# Patient Record
Sex: Female | Born: 1960 | Race: White | Hispanic: No | Marital: Married
Health system: Midwestern US, Community
[De-identification: ages and names within clinical notes are randomized; demographics above are authoritative.]

## PROBLEM LIST (undated history)

## (undated) DIAGNOSIS — K219 Gastro-esophageal reflux disease without esophagitis: Secondary | ICD-10-CM

## (undated) DIAGNOSIS — Z801 Family history of malignant neoplasm of trachea, bronchus and lung: Secondary | ICD-10-CM

## (undated) DIAGNOSIS — R5383 Other fatigue: Secondary | ICD-10-CM

## (undated) DIAGNOSIS — J069 Acute upper respiratory infection, unspecified: Secondary | ICD-10-CM

## (undated) DIAGNOSIS — F419 Anxiety disorder, unspecified: Secondary | ICD-10-CM

## (undated) DIAGNOSIS — Z01419 Encounter for gynecological examination (general) (routine) without abnormal findings: Secondary | ICD-10-CM

---

## 2011-11-30 ENCOUNTER — Other Ambulatory Visit

## 2013-04-09 NOTE — Progress Notes (Signed)
Jessica Stanley is a 53 y.o. female in to establish care.  Pt states that she has been having off and on episodes of SOB.  She has a hx of Anxiety, in which she takes meds for it.  Exercise induced SOB and feelings of heart palps.  States that she has 30 episodes of the "all of a sudden SOB" a day.

## 2013-04-09 NOTE — Patient Instructions (Addendum)
Palpitations: After Your Visit  Your Care Instructions     Heart palpitations are the uncomfortable sensation that your heart is beating fast or irregularly. You might feel pounding or fluttering in your chest. It might feel like your heart is skipping a beat.  Although palpitations may be caused by a heart problem, they also occur because of stress, fatigue, or use of alcohol, caffeine, or nicotine. Many medicines, including diet pills, antihistamines, decongestants, and some herbal products, can cause heart palpitations. Nearly everyone has palpitations from time to time.  Depending on your symptoms, your doctor may need to do more tests to try to find the cause of your palpitations.  Follow-up care is a key part of your treatment and safety. Be sure to make and go to all appointments, and call your doctor if you are having problems. It's also a good idea to know your test results and keep a list of the medicines you take.  How can you care for yourself at home?  ?? Avoid caffeine, nicotine, and excess alcohol.  ?? Do not take illegal drugs, such as methamphetamines and cocaine.  ?? Do not take weight loss or diet medicines unless you talk with your doctor first.  ?? Get plenty of sleep.  ?? Do not overeat.  ?? If you have palpitations again, take deep breaths and try to relax. This may slow a racing heart.  ?? If you start to feel lightheaded, lie down to avoid injuries that might result if you pass out and fall down.  ?? Keep a record of your palpitations and bring it to your next doctor's appointment. Write down:  ?? The date and time.  ?? Your pulse. (If your heart is beating fast, it may be hard to count your pulse.)  ?? What you were doing when the palpitations started.  ?? How long the palpitations lasted.  ?? Any other symptoms.  ?? If an activity causes palpitations, slow down or stop. Talk to your doctor before you do that activity again.  ?? Take your medicines exactly as prescribed. Call your doctor if you think  you are having a problem with your medicine.  When should you call for help?  Call 911 anytime you think you may need emergency care. For example, call if:  ?? You passed out (lost consciousness).  ?? You have symptoms of a heart attack. These may include:  ?? Chest pain or pressure, or a strange feeling in the chest.  ?? Sweating.  ?? Shortness of breath.  ?? Pain, pressure, or a strange feeling in the back, neck, jaw, or upper belly or in one or both shoulders or arms.  ?? Lightheadedness or sudden weakness.  ?? A fast or irregular heartbeat.  After you call 911, the operator may tell you to chew 1 adult-strength or 2 to 4 low-dose aspirin. Wait for an ambulance. Do not try to drive yourself.  ?? You have signs of a stroke. These may include:  ?? Sudden numbness, paralysis, or weakness in your face, arm, or leg, especially on only one side of your body.  ?? New problems with walking or balance.  ?? Sudden vision changes.  ?? Drooling or slurred speech.  ?? New problems speaking or understanding simple statements, or feeling confused.  ?? A sudden, severe headache that is different from past headaches.  Call your doctor now or seek immediate medical care if:  ?? You have heart palpitations and:  ?? Are dizzy or lightheaded, or   you feel like you may faint.  ?? Have new or increased shortness of breath.  Watch closely for changes in your health, and be sure to contact your doctor if:  ?? You continue to have heart palpitations.   Where can you learn more?   Go to [email or web address]  Enter R508 in the search box to learn more about "Palpitations: After Your Visit."   ?? 2006-2014 Healthwise, Incorporated. Care instructions adapted under license by [employer] (which disclaims liability or warranty for this information). This care instruction is for use with your licensed healthcare professional. If you have questions about a medical condition or this instruction, always ask your healthcare professional. Healthwise,  Incorporated disclaims any warranty or liability for your use of this information.  Content Version: 10.2.346038; Current as of: May 17, 2012              Gastroesophageal Reflux Disease (GERD): After Your Visit  Your Care Instructions     Gastroesophageal reflux disease (GERD) is the backward flow of stomach acid into the esophagus. The esophagus is the tube that leads from your throat to your stomach. A one-way valve prevents the stomach acid from moving up into this tube. When you have GERD, this valve does not close tightly enough.  If you have mild GERD symptoms including heartburn, you may be able to control the problem with antacids or over-the-counter medicine. Changing your diet, losing weight, and making other lifestyle changes can also help reduce symptoms.  Follow-up care is a key part of your treatment and safety. Be sure to make and go to all appointments, and call your doctor if you are having problems. It???s also a good idea to know your test results and keep a list of the medicines you take.  How can you care for yourself at home?  ?? Take your medicines exactly as prescribed. Call your doctor if you think you are having a problem with your medicine.  ?? Your doctor may recommend over-the-counter medicine. For mild or occasional indigestion, antacids, such as Tums, Gaviscon, Mylanta, or Maalox, may help. Your doctor also may recommend over-the-counter acid reducers, such as Pepcid AC, Tagamet HB, Zantac 75, or Prilosec. Read and follow all instructions on the label. If you use these medicines often, talk with your doctor.  ?? Change your eating habits.  ?? It???s best to eat several small meals instead of two or three large meals.  ?? After you eat, wait 2 to 3 hours before you lie down.  ?? Chocolate, mint, and alcohol can make GERD worse.  ?? Spicy foods, foods that have a lot of acid (like tomatoes and oranges), and coffee can make GERD symptoms worse in some people. If your symptoms are worse after you  eat a certain food, you may want to stop eating that food to see if your symptoms get better.  ?? Do not smoke or chew tobacco. Smoking can make GERD worse. If you need help quitting, talk to your doctor about stop-smoking programs and medicines. These can increase your chances of quitting for good.  ?? If you have GERD symptoms at night, raise the head of your bed 6 to 8 inches by putting the frame on blocks or placing a foam wedge under the head of your mattress. (Adding extra pillows does not work.)  ?? Do not wear tight clothing around your middle.  ?? Lose weight if you need to. Losing just 5 to 10 pounds can help.  When should you  call for help?  Call your doctor now or seek immediate medical care if:  ?? You have new or different belly pain.  ?? Your stools are black and tarlike or have streaks of blood.  Watch closely for changes in your health, and be sure to contact your doctor if:  ?? Your symptoms have not improved after 2 days.  ?? Food seems to catch in your throat or chest.   Where can you learn more?   Go to [email or web address]  Enter [phone] 372-2663T927 in the search box to learn more about "Gastroesophageal Reflux Disease (GERD): After Your Visit."   ?? 2006-2014 Healthwise, Incorporated. Care instructions adapted under license by [employer] (which disclaims liability or warranty for this information). This care instruction is for use with your licensed healthcare professional. If you have questions about a medical condition or this instruction, always ask your healthcare professional. Healthwise, Incorporated disclaims any warranty or liability for your use of this information.  Content Version: 10.2.346038; Current as of: Jul 23, 2011              Cholesterol and Triglycerides Tests: About These Tests  What are they?  Cholesterol and triglycerides tests measure the amount of fats in your blood. These fats have both "good" (HDL) and "bad" (LDL) cholesterol.  Why are these tests done?  These tests are  done to help find out your risk of a heart attack and stroke. They can help your doctor find out how well medicine is working for some health problems.  How can you prepare for these tests?  ?? Your doctor may tell you to fast before your tests. This means that you do not eat or drink anything except water for 9 to 12 hours before the tests. In most cases, you can take your medicines with water the morning of the test.  ?? Do not eat high-fat foods the night before the tests.  ?? Do not drink alcohol or do intense exercise the night before the tests.  ?? Be sure to tell your doctor about all the over-the-counter and prescription medicines and herbs or other supplements you take. They can affect the results of these tests.  What happens during these tests?  A health professional takes a sample of your blood.  What else should you know about these tests?  Your cholesterol levels can help your doctor find out your risk for having a heart attack or stroke.  But it's not just about your cholesterol. Your doctor uses your cholesterol levels plus other things to calculate your risk. These include:  ?? Your blood pressure.  ?? Whether or not you have diabetes.  ?? Your age, sex, and race.  ?? Whether or not you smoke.  You and your doctor can talk about whether you need to lower your risk and what treatment is best for you.   Where can you learn more?   Go to [email or web address]  Enter [phone] in the search box to learn more about "Cholesterol and Triglycerides Tests: About These Tests."   ?? 2006-2014 Healthwise, Incorporated. Care instructions adapted under license by [employer]  (which disclaims liability or warranty for this information). This care instruction is for use with your licensed healthcare professional. If you have questions about a medical condition or this instruction, always ask your healthcare professional. Healthwise, Incorporated disclaims any warranty or liability for your use of this information.   Content Version: 10.2.346038; Current as of: May 17, 2012

## 2013-04-10 NOTE — Progress Notes (Signed)
HPI  Jessica Stanley is a 53 y.o. female that presents with  Chief Complaint   Patient presents with   ??? Establish Care   ??? Anxiety     Jessica Stanley is here to establish care. She was previously a pt of Dr. Leonor Stanley for 24 years. She reports she generally is healthy. She has h/o Genella Rife and takes zantac three times a day for total of 450 mg a day for few years. She reports that she tried prilosec but did not like the way it made her felt. She has never upper endoscopy and still has some breakthrough symptoms with known offending foods. She has increased flatus and burping. She has had 3 colonoscopies by Dr. 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 She takes takes miralax daily for constipation control. She is due for repeat colonoscopy this June. She reports she tries to eat healthy and be physically active.     She reports that since December with almost daily episodes of brief sob, in which her breath "catches"/brief palpitations/chest pressure and then she burps or cough and the symptoms stops. She had initially attibuted symptoms to possible anxiety, but she feels it may not be this now. She does admit to some anxiety at times especially if in claustrophobic situations or flying. She rarely uses xanax and denies need for refills..   No chest pain or symptoms presently. She drinks caffeine daily. No wheezing or h/o asthma.    Review of Systems:  ROS:  History obtained from the patient  ?? General: negative for - chills, fever, weight changes or malaise, or fatigue  ?? HEENT: no sore throat, nasal congestion,  vision problems. Off/on ear pressure.  ?? Respiratory: no cough,  or wheezing  ?? Cardiovascular: see hpi.  ?? Gastrointestinal: no abdominal pain, N/V, change in bowel habits, or black or bloody stools  ?? Genitourinary:  No dysuria or hematuria  ?? Musculoskeletal: no back pain, joint pain, joint stiffness, muscle pain or muscle weakness  ?? Neurological: no numbness, tingling, headache or dizziness, or  seizures  ?? Dermatological:  No rashes or other skin concerns  ?? Psychological: negative for -  depression, sleep disturbances, suicidal or homicidal ideations      Past Medical History   Diagnosis Date   ??? Anxiety        Family History   Problem Relation Age of Onset   ??? Cancer Mother      breast   ??? Stroke Father    ??? Heart Disease Father    ??? Hypertension Father    ??? Heart Disease Maternal Grandmother    ??? Heart Failure Maternal Grandmother    ??? Heart Disease Maternal Grandfather    ??? No Known Problems Paternal Grandmother    ??? Heart Attack Paternal Grandfather        Past Surgical History   Procedure Laterality Date   ??? Hx colonoscopy     ??? Hx appendectomy     ??? Hx partial hysterectomy       Removal of both fallopian tubes and one ovary.   ??? Hx cesarean section     ??? Hx other surgical       Dental implants   ??? Hx orthopaedic  1982     Broken right hand       History     Social History   ??? Marital Status: MARRIED     Spouse Name: N/A     Number of  Children: N/A   ??? Years of Education: N/A     Occupational History   ??? Not on file.     Social History Main Topics   ??? Smoking status: Former Smoker -- 1.00 packs/day for 15 years     Types: Cigarettes     Start date: 04/09/1978     Quit date: 04/09/1993   ??? Smokeless tobacco: Never Used   ??? Alcohol Use: Yes      Comment: 2 glasses of wine a day   ??? Drug Use: No   ??? Sexually Active: Yes -- Female partner(s)     Birth Control/ Protection: None     Other Topics Concern   ??? Military Service No   ??? Blood Transfusions No   ??? Caffeine Concern No   ??? Occupational Exposure No   ??? Hobby Hazards No   ??? Sleep Concern No   ??? Stress Concern Yes     Job related   ??? Weight Concern No   ??? Special Diet No   ??? Back Care Yes   ??? Exercise Yes   ??? Seat Belt Yes   ??? Self-Exams Yes     Social History Narrative   ??? No narrative on file       Outpatient Prescriptions Marked as Taking for the 04/09/13 encounter (Office Visit) with Jessica Galas, MD   Medication Sig Dispense Refill   ??? ranitidine  (ZANTAC) 150 mg tablet Take 450 mg by mouth daily.       ??? ALPRAZolam (XANAX) 0.25 mg tablet Take 0.25 mg by mouth nightly as needed.       ??? ibuprofen (ADVIL) 200 mg tablet Take 600 mg by mouth every eight (8) hours as needed.       ??? cholecalciferol, vitamin D3, (VITAMIN D3) 2,000 unit tab Take 4,000 Units by mouth daily.       ??? progesterone (PROMETRIUM) 100 mg capsule Take 100 mg by mouth daily.           Allergies   Allergen Reactions   ??? Pcn [Penicillins] Angioedema       Filed Vitals:    04/09/13 1045   BP: 117/59   Pulse: 87   Temp: 98 ??F (36.7 ??C)   TempSrc: Oral   Resp: 18   Height: 5\' 1"  (1.549 m)   Weight: 125 lb (56.7 kg)   SpO2: 97%         Physical Examination: General appearance - alert, well appearing, and in no distress    Mental status - alert, oriented to person, place, and time  Eyes - pupils equal and reactive, extraocular eye movements intact  Ears - bilateral TM's and external ear canals normal  Nose - normal and patent, no erythema or discharge , sinuses normal and nontender   Mouth - mucous membranes moist, pharynx normal without lesions   Neck - supple, no significant adenopathy  PULM: clear to auscultation, no wheezes, rales or rhonchi, symmetric air entry  CVS - normal rate, regular rhythm, normal S1, S2, no murmurs, rubs, clicks or gallops  GI - soft, nontender, nondistended, no masses or organomegaly   Back exam - full range of motion, no tenderness, palpable spasm or pain on motion  Neurological - alert, oriented, normal speech, no focal findings or movement disorder noted   Musculoskeletal - no joint tenderness, deformity or swelling  Extremities - no pedal edema noted, good pulses, normal color, temperature   Skin - normal coloration and turgor,  no rashes noted or suspicious skin lesions noted on exposed surfaces    LABS/IMAGING:  No results found for any previous visit.    Assessment/ Plan    Diagnoses and associated orders for this visit:    Heart palpitations: she is advised to  avoid caffeine,which can exascerbate palpitations. ECG sinus rhythm with no acute changes. WNL  - AMB POC EKG ROUTINE W/ 12 LEADS, INTER & REP  - REFERRAL TO CARDIOLOGY  - LIPID PANEL  - TSH+FREE T4  - METABOLIC PANEL, COMPREHENSIVE  - CBC WITH AUTOMATED DIFF    Encounter to establish care    GERD (gastroesophageal reflux disease): as pt taken zantac in excess dosage, and has breakthrough gerd symptoms. Feel prudent she may need upper endoscopy. She prefers to use her current GI doctor. She is advised to try otc prevacid instead of zantac. She is advised to avoid nsaids which may exascerbate gerd.   - REFERRAL TO GASTROENTEROLOGY  - LIPID PANEL      Follow-up Disposition:  Return in about 1 month (around 05/07/2013), or if symptoms worsen or fail to improve. Return for fasting labs this week..  She is advised if persistent palpations, sob, onset of chest pain, worsenig, to seek immediate medical care. She verbalized understanding.   I have discussed the diagnosis with the patient and the intended plan as seen in the above orders.  The patient has received an after-visit summary and questions were answered concerning future plans.     Medication Side Effects and Warnings were discussed with patient: yes      Darene LamerLYNNETTE M Kayd Launer, MD

## 2013-04-22 LAB — METABOLIC PANEL, COMPREHENSIVE
A-G Ratio: 1.7 (ref 1.1–2.5)
ALT (SGPT): 12 IU/L (ref 0–32)
AST (SGOT): 17 IU/L (ref 0–40)
Albumin: 4.3 g/dL (ref 3.5–5.5)
Alk. phosphatase: 79 IU/L (ref 39–117)
BUN/Creatinine ratio: 16 (ref 9–23)
BUN: 12 mg/dL (ref 6–24)
Bilirubin, total: 0.5 mg/dL (ref 0.0–1.2)
CO2: 27 mmol/L (ref 18–29)
Calcium: 9.4 mg/dL (ref 8.7–10.2)
Chloride: 100 mmol/L (ref 97–108)
Creatinine: 0.74 mg/dL (ref 0.57–1.00)
GFR est AA: 106 mL/min/{1.73_m2} (ref >59)
GFR est non-AA: 92 mL/min/{1.73_m2} (ref >59)
GLOBULIN, TOTAL: 2.5 g/dL (ref 1.5–4.5)
Glucose: 91 mg/dL (ref 65–99)
Potassium: 4.5 mmol/L (ref 3.5–5.2)
Protein, total: 6.8 g/dL (ref 6.0–8.5)
Sodium: 141 mmol/L (ref 134–144)

## 2013-04-22 LAB — CBC WITH AUTOMATED DIFF
ABS. BASOPHILS: 0.1 10*3/uL (ref 0.0–0.2)
ABS. EOSINOPHILS: 0.3 10*3/uL (ref 0.0–0.4)
ABS. IMM. GRANS.: 0 10*3/uL (ref 0.0–0.1)
ABS. MONOCYTES: 0.4 10*3/uL (ref 0.1–0.9)
ABS. NEUTROPHILS: 2.5 10*3/uL (ref 1.4–7.0)
Abs Lymphocytes: 1.3 10*3/uL (ref 0.7–3.1)
BASOPHILS: 1 %
EOSINOPHILS: 7 %
HCT: 42.6 % (ref 34.0–46.6)
HGB: 14.4 g/dL (ref 11.1–15.9)
IMMATURE GRANULOCYTES: 0 %
Lymphocytes: 28 %
MCH: 30 pg (ref 26.6–33.0)
MCHC: 33.8 g/dL (ref 31.5–35.7)
MCV: 89 fL (ref 79–97)
MONOCYTES: 9 %
NEUTROPHILS: 55 %
PLATELET: 252 10*3/uL (ref 150–379)
RBC: 4.8 x10E6/uL (ref 3.77–5.28)
RDW: 12.5 % (ref 12.3–15.4)
WBC: 4.6 10*3/uL (ref 3.4–10.8)

## 2013-04-22 LAB — LIPID PANEL
Cholesterol, total: 197 mg/dL (ref 100–199)
HDL Cholesterol: 116 mg/dL (ref >39)
LDL, calculated: 73 mg/dL (ref 0–99)
Triglyceride: 42 mg/dL (ref 0–149)
VLDL, calculated: 8 mg/dL (ref 5–40)

## 2013-04-22 LAB — CVD REPORT: PDF IMAGE: 0

## 2013-04-22 LAB — TSH+FREE T4
Free T4: 1 ng/dL
TSH-ICMA: 1.4 uU/mL

## 2013-04-23 NOTE — Telephone Encounter (Signed)
-----   Message from Penelope GalasLynnette Moore, MD sent at 04/23/2013 10:06 AM EST -----  Please advise patient her labwork is normal.

## 2013-04-23 NOTE — Telephone Encounter (Signed)
Called pt to advise of the below stated information, no answ LVM to advise

## 2013-04-23 NOTE — Telephone Encounter (Signed)
-----   Message from Lynnette Moore, MD sent at 04/23/2013 10:06 AM EST -----  Please advise patient her labwork is normal.

## 2013-04-23 NOTE — Telephone Encounter (Signed)
Called and spk to patient via cell phone, advised of the below stated information

## 2013-04-23 NOTE — Progress Notes (Signed)
Quick Note:    Please advise patient her labwork is normal.  ______

## 2013-05-09 NOTE — Telephone Encounter (Signed)
Left message for patient to call office regarding new patient referral for palpitations.

## 2013-08-02 NOTE — Patient Instructions (Signed)
Leg Pain: After Your Visit  Your Care Instructions  Many things can cause leg pain. Too much exercise or overuse can cause a muscle cramp (or charley horse). You can get leg cramps from not eating a balanced diet that has enough potassium, calcium, and other minerals. If you do not drink enough fluids or are taking certain medicines, you may develop leg cramps. Other causes of leg pain include injuries, blood flow problems, nerve damage, and twisted and enlarged veins (varicose veins).  You can usually ease pain with self-care. Your doctor may recommend that you rest your leg and keep it elevated.  Follow-up care is a key part of your treatment and safety. Be sure to make and go to all appointments, and call your doctor if you are having problems. It???s also a good idea to know your test results and keep a list of the medicines you take.  How can you care for yourself at home?  ?? Take pain medicines exactly as directed.  ?? If the doctor gave you a prescription medicine for pain, take it as prescribed.  ?? If you are not taking a prescription pain medicine, ask your doctor if you can take an over-the-counter medicine.  ?? Take any other medicines exactly as prescribed. Call your doctor if you think you are having a problem with your medicine.  ?? Rest your leg while you have pain, and avoid standing for long periods of time.  ?? Prop up your leg at or above the level of your heart when possible.  ?? Make sure you are eating a balanced diet that is rich in calcium, potassium, and magnesium, especially if you are pregnant.  ?? If directed by your doctor, put ice or a cold pack on the area for 10 to 20 minutes at a time. Put a thin cloth between the ice and your skin.  ?? Your leg may be in a splint, a brace, or an elastic bandage, and you may have crutches to help you walk. Follow your doctor's directions about how long to wear supports and how to use the crutches.  When should you call for help?  Call 911 anytime you think  you may need emergency care. For example, call if:  ?? You have sudden chest pain and shortness of breath, or you cough up blood.  ?? Your leg is cool or pale or changes color.  Call your doctor now or seek immediate medical care if:  ?? You have increasing or severe pain.  ?? Your leg suddenly feels weak and you cannot move it.  ?? You have signs of a blood clot, such as:  ?? Pain in your calf, back of the knee, thigh, or groin.  ?? Redness and swelling in your leg or groin.  ?? You have signs of infection, such as:  ?? Increased pain, swelling, warmth, or redness.  ?? Red streaks leading from the sore area.  ?? Pus draining from a place on your leg.  ?? A fever.  ?? You cannot bear weight on your leg.  Watch closely for changes in your health, and be sure to contact your doctor if:  ?? You do not get better as expected.   Where can you learn more?   Go to http://www.healthwise.net/[facility]  Enter W987 in the search box to learn more about "Leg Pain: After Your Visit."   ?? 2006-2015 Healthwise, Incorporated. Care instructions adapted under license by [employer] (which disclaims liability or warranty for this information).   This care instruction is for use with your licensed healthcare professional. If you have questions about a medical condition or this instruction, always ask your healthcare professional. Healthwise, Incorporated disclaims any warranty or liability for your use of this information.  Content Version: 10.4.390249; Current as of: January 19, 2013

## 2013-08-02 NOTE — Progress Notes (Signed)
HPI  Jessica Stanley is a 53 y.o. female that presents with  Chief Complaint   Patient presents with   ??? Leg Pain     Pt reports that she has noted 4 weeks of left posterior upper leg pain in same location. No known trauma. She is physically active and works out. No long car/plane trips. No redness or swelling. No sob or chest pain. She is not sure if just muscle, but id concerned if possible blood clot since her father had one in his 69's. She has noted off/on left knee discomfort with working out, mainly with squats.     She also needs referral to Dr. Constance Goltz, her gi doctor. She had a colon polyp removed last summer and was advised to have repeat one done in one year.     Review of Systems:  ROS:  History obtained from the patient  ?? General: negative for - chills, fever  ?? HEENT: no sore throat  ?? Respiratory: no cough, shortness of breath, or wheezing  ?? Cardiovascular: no chest pain, palpitations, or dyspnea on exertion  ?? Gastrointestinal: no abdominal pain, N/V, change in bowel habits, or black or bloody stools  ?? Genitourinary:  No dysuria or hematuria  ?? Musculoskeletal: see hpi.  ?? Neurological: no numbness, tingling, headache or dizziness, or seizures  ?? Dermatological:  No rashes or other skin concerns  ?? Psychological: negative for - anxiety, depression, sleep disturbances, suicidal or homicidal ideations      Past Medical History   Diagnosis Date   ??? Anxiety        Family History   Problem Relation Age of Onset   ??? Cancer Mother      breast   ??? Stroke Father    ??? Heart Disease Father    ??? Hypertension Father    ??? Heart Disease Maternal Grandmother    ??? Heart Failure Maternal Grandmother    ??? Heart Disease Maternal Grandfather    ??? No Known Problems Paternal Grandmother    ??? Heart Attack Paternal Grandfather        Past Surgical History   Procedure Laterality Date   ??? Hx colonoscopy     ??? Hx appendectomy     ??? Hx partial hysterectomy       Removal of both fallopian tubes and one ovary.   ??? Hx cesarean  section     ??? Hx other surgical       Dental implants   ??? Hx orthopaedic  1982     Broken right hand       History     Social History   ??? Marital Status: MARRIED     Spouse Name: N/A     Number of Children: N/A   ??? Years of Education: N/A     Occupational History   ??? Not on file.     Social History Main Topics   ??? Smoking status: Former Smoker -- 1.00 packs/day for 15 years     Types: Cigarettes     Start date: 04/09/1978     Quit date: 04/09/1993   ??? Smokeless tobacco: Never Used   ??? Alcohol Use: Yes      Comment: 2 glasses of wine a day   ??? Drug Use: No   ??? Sexual Activity:     Partners: Male     Patent examiner Protection: None     Other Topics Concern   ??? Military Service No   ???  Blood Transfusions No   ??? Caffeine Concern No   ??? Occupational Exposure No   ??? Hobby Hazards No   ??? Sleep Concern No   ??? Stress Concern Yes     Job related   ??? Weight Concern No   ??? Special Diet No   ??? Back Care Yes   ??? Exercise Yes   ??? Seat Belt Yes   ??? Self-Exams Yes     Social History Narrative       Allergies   Allergen Reactions   ??? Pcn [Penicillins] Angioedema       Filed Vitals:    08/02/13 1627   BP: 110/84   Pulse: 71   Temp: 98 ??F (36.7 ??C)   TempSrc: Oral   Resp: 16   Height: 5' 0.5" (1.537 m)   Weight: 122 lb 12.8 oz (55.702 kg)         Physical Examination: General appearance - alert, well appearing, and in no distress    Mental status - alert, oriented to person, place, and time  PULM: clear to auscultation, no wheezes, rales or rhonchi, symmetric air entry  CVS - normal rate, regular rhythm, normal S1, S2, no murmurs, rubs, clicks or gallops  GI - soft, nontender, nondistended, no masses or organomegaly   Back exam - full range of motion, no tenderness, palpable spasm or pain on motion  Neurological - alert, oriented, normal speech, no focal findings or movement disorder noted   Musculoskeletal - no joint tenderness, deformity or swelling; mild left knee crepitation.  Extremities - no pedal edema noted, good pulses, normal  color, temperature; localized reproducible mid hamstring area tenderness to deep palpation, no redness or swelling or obvious cord noted.   Skin - normal coloration and turgor, no rashes noted or suspicious skin lesions noted on exposed surfaces    LABS/IMAGING:  No visits with results within 3 Month(s) from this visit.  Latest known visit with results is:    Office Visit on 04/09/2013   Component Date Value Ref Range Status   ??? Cholesterol, total 04/13/2013 197  100 - 199 mg/dL Final   ??? Triglyceride 04/13/2013 42  0 - 149 mg/dL Final   ??? HDL Cholesterol 04/13/2013 116  >39 mg/dL Final    Comment: According to ATP-III Guidelines, HDL-C >59 mg/dL is considered a  negative risk factor for CHD.     ??? VLDL, calculated 04/13/2013 8  5 - 40 mg/dL Final   ??? LDL, calculated 04/13/2013 73  0 - 99 mg/dL Final   ??? TSH-ICMA 04/13/2013 1.4   Final    Comment: Reference Range:  Pubertal Children and Adults: 0.5 - 4.8     ??? Free T4 04/13/2013 1.0   Final    Comment: Reference Range:  Pubertal Children and Adults: 0.8 - 1.7     ??? Glucose 04/13/2013 91  65 - 99 mg/dL Final   ??? BUN 04/13/2013 12  6 - 24 mg/dL Final   ??? Creatinine 04/13/2013 0.74  0.57 - 1.00 mg/dL Final   ??? GFR est non-AA 04/13/2013 92  >59 mL/min/1.73 Final   ??? GFR est AA 04/13/2013 106  >59 mL/min/1.73 Final   ??? BUN/Creatinine ratio 04/13/2013 16  9 - 23 Final   ??? Sodium 04/13/2013 141  134 - 144 mmol/L Final   ??? Potassium 04/13/2013 4.5  3.5 - 5.2 mmol/L Final   ??? Chloride 04/13/2013 100  97 - 108 mmol/L Final   ??? CO2 04/13/2013  27  18 - 29 mmol/L Final   ??? Calcium 04/13/2013 9.4  8.7 - 10.2 mg/dL Final   ??? Protein, total 04/13/2013 6.8  6.0 - 8.5 g/dL Final   ??? Albumin 04/13/2013 4.3  3.5 - 5.5 g/dL Final   ??? GLOBULIN, TOTAL 04/13/2013 2.5  1.5 - 4.5 g/dL Final   ??? A-G Ratio 04/13/2013 1.7  1.1 - 2.5 Final   ??? Bilirubin, total 04/13/2013 0.5  0.0 - 1.2 mg/dL Final   ??? Alk. phosphatase 04/13/2013 79  39 - 117 IU/L Final   ??? AST 04/13/2013 17  0 - 40 IU/L Final    ??? ALT 04/13/2013 12  0 - 32 IU/L Final   ??? WBC 04/13/2013 4.6  3.4 - 10.8 x10E3/uL Final   ??? RBC 04/13/2013 4.80  3.77 - 5.28 x10E6/uL Final   ??? HGB 04/13/2013 14.4  11.1 - 15.9 g/dL Final   ??? HCT 04/13/2013 42.6  34.0 - 46.6 % Final   ??? MCV 04/13/2013 89  79 - 97 fL Final   ??? MCH 04/13/2013 30.0  26.6 - 33.0 pg Final   ??? MCHC 04/13/2013 33.8  31.5 - 35.7 g/dL Final   ??? RDW 04/13/2013 12.5  12.3 - 15.4 % Final   ??? PLATELET 04/13/2013 252  150 - 379 x10E3/uL Final   ??? NEUTROPHILS 04/13/2013 55   Final   ??? Lymphocytes 04/13/2013 28   Final   ??? MONOCYTES 04/13/2013 9   Final   ??? EOSINOPHILS 04/13/2013 7   Final   ??? BASOPHILS 04/13/2013 1   Final   ??? ABS. NEUTROPHILS 04/13/2013 2.5  1.4 - 7.0 x10E3/uL Final   ??? Abs Lymphocytes 04/13/2013 1.3  0.7 - 3.1 x10E3/uL Final   ??? ABS. MONOCYTES 04/13/2013 0.4  0.1 - 0.9 x10E3/uL Final   ??? ABS. EOSINOPHILS 04/13/2013 0.3  0.0 - 0.4 x10E3/uL Final   ??? ABS. BASOPHILS 04/13/2013 0.1  0.0 - 0.2 x10E3/uL Final   ??? IMMATURE GRANULOCYTES 04/13/2013 0   Final   ??? ABS. IMM. GRANS. 04/13/2013 0.0  0.0 - 0.1 x10E3/uL Final   ??? INTERPRETATION 04/13/2013 Note   Final    For Interpretations, please refer to [facility] CDS Patient Report.   ??? PDF IMAGE 04/13/2013 .   Final       Assessment/ Plan    Jazelle was seen today for leg pain.    Diagnoses and associated orders for this visit:    Leg pain, posterior, left: though it may be muscular origin, cannot r/o vascular source of pain (lower likelihood based on exam) and advise vein [place]. Family h/o dvt.  - REFERRAL TO VASCULAR CENTER    Referral of patient  - REFERRAL TO GASTROENTEROLOGY      Follow-up Disposition:  Return if symptoms worsen or fail to improve.  I have reviewed return/Er/medication precautions and  discussed the diagnosis with the patient (or guardian/parent if applicable)  and the intended plan as seen in the above orders.  The patient has been offered and/or received an after-visit summary and questions were answered concerning  future plans. Patient verbalized understanding of all instruction.    Cheri Rous, MD

## 2013-08-02 NOTE — Progress Notes (Signed)
Pt Jessica Stanley is a 53 y.o. female in office today for left leg pain.  Per pt stated tht pain is intermittent in the back of thigh.

## 2013-11-08 MED ORDER — PANTOPRAZOLE 20 MG TAB, DELAYED RELEASE
20 mg | ORAL_TABLET | Freq: Every day | ORAL | Status: DC
Start: 2013-11-08 — End: 2014-01-24

## 2013-11-08 NOTE — Patient Instructions (Signed)
Gastroesophageal Reflux Disease (GERD): After Your Visit  Your Care Instructions     Gastroesophageal reflux disease (GERD) is the backward flow of stomach acid into the esophagus. The esophagus is the tube that leads from your throat to your stomach. A one-way valve prevents the stomach acid from moving up into this tube. When you have GERD, this valve does not close tightly enough.  If you have mild GERD symptoms including heartburn, you may be able to control the problem with antacids or over-the-counter medicine. Changing your diet, losing weight, and making other lifestyle changes can also help reduce symptoms.  Follow-up care is a key part of your treatment and safety. Be sure to make and go to all appointments, and call your doctor if you are having problems. It???s also a good idea to know your test results and keep a list of the medicines you take.  How can you care for yourself at home?  ?? Take your medicines exactly as prescribed. Call your doctor if you think you are having a problem with your medicine.  ?? Your doctor may recommend over-the-counter medicine. For mild or occasional indigestion, antacids, such as Tums, Gaviscon, Mylanta, or Maalox, may help. Your doctor also may recommend over-the-counter acid reducers, such as Pepcid AC, Tagamet HB, Zantac 75, or Prilosec. Read and follow all instructions on the label. If you use these medicines often, talk with your doctor.  ?? Change your eating habits.  ?? It???s best to eat several small meals instead of two or three large meals.  ?? After you eat, wait 2 to 3 hours before you lie down.  ?? Chocolate, mint, and alcohol can make GERD worse.  ?? Spicy foods, foods that have a lot of acid (like tomatoes and oranges), and coffee can make GERD symptoms worse in some people. If your symptoms are worse after you eat a certain food, you may want to stop eating that food to see if your symptoms get better.   ?? Do not smoke or chew tobacco. Smoking can make GERD worse. If you need help quitting, talk to your doctor about stop-smoking programs and medicines. These can increase your chances of quitting for good.  ?? If you have GERD symptoms at night, raise the head of your bed 6 to 8 inches by putting the frame on blocks or placing a foam wedge under the head of your mattress. (Adding extra pillows does not work.)  ?? Do not wear tight clothing around your middle.  ?? Lose weight if you need to. Losing just 5 to 10 pounds can help.  When should you call for help?  Call your doctor now or seek immediate medical care if:  ?? You have new or different belly pain.  ?? Your stools are black and tarlike or have streaks of blood.  Watch closely for changes in your health, and be sure to contact your doctor if:  ?? Your symptoms have not improved after 2 days.  ?? Food seems to catch in your throat or chest.   Where can you learn more?   Go to http://www.healthwise.net/[facility]  Enter T927 in the search box to learn more about "Gastroesophageal Reflux Disease (GERD): After Your Visit."   ?? 2006-2015 Healthwise, Incorporated. Care instructions adapted under license by [facility] (which disclaims liability or warranty for this information). This care instruction is for use with your licensed healthcare professional. If you have questions about a medical condition or this instruction, always ask your healthcare professional.   Healthwise, Incorporated disclaims any warranty or liability for your use of this information.  Content Version: 10.5.422740; Current as of: January 19, 2013

## 2013-11-08 NOTE — Progress Notes (Signed)
HPI  Jessica Stanley is a 53 y.o. female that presents with  Chief Complaint   Patient presents with   ??? Abdominal Pain     reflux   ??? Gas     Yesterday pt ate at [facility] place and had extra sauces.  Later in evening with increased gassiness, with burping and flatus, which has persisted. She has had random sharp brief pains in right upper abdomen. No fevers, chills, vomiting, black/bloody stools or diarrhea. No rashes or joint pains, headaches or dizziness..  No chest pain. She has long h/o gerd breakthrough despite using zantac. She reports she has planned colonoscopy this month. She has not discussed her gerd with her GI provider.      Review of Systems:  ROS:  See hpi. No sob or chest pain.    Past Medical History   Diagnosis Date   ??? Anxiety        Family History   Problem Relation Age of Onset   ??? Cancer Mother      breast   ??? Stroke Father    ??? Heart Disease Father    ??? Hypertension Father    ??? Heart Disease Maternal Grandmother    ??? Heart Failure Maternal Grandmother    ??? Heart Disease Maternal Grandfather    ??? No Known Problems Paternal Grandmother    ??? Heart Attack Paternal Grandfather        Past Surgical History   Procedure Laterality Date   ??? Hx colonoscopy     ??? Hx appendectomy     ??? Hx partial hysterectomy       Removal of both fallopian tubes and one ovary.   ??? Hx cesarean section     ??? Hx other surgical       Dental implants   ??? Hx orthopaedic  1982     Broken right hand       History     Social History   ??? Marital Status: MARRIED     Spouse Name: N/A     Number of Children: N/A   ??? Years of Education: N/A     Occupational History   ??? Not on file.     Social History Main Topics   ??? Smoking status: Former Smoker -- 1.00 packs/day for 15 years     Types: Cigarettes     Start date: 04/09/1978     Quit date: 04/09/1993   ??? Smokeless tobacco: Never Used   ??? Alcohol Use: Yes      Comment: 2 glasses of wine a day   ??? Drug Use: No   ??? Sexual Activity:     Partners: Male     Patent examiner Protection: None      Other Topics Concern   ??? Military Service No   ??? Blood Transfusions No   ??? Caffeine Concern No   ??? Occupational Exposure No   ??? Hobby Hazards No   ??? Sleep Concern No   ??? Stress Concern Yes     Job related   ??? Weight Concern No   ??? Special Diet No   ??? Back Care Yes   ??? Exercise Yes   ??? Seat Belt Yes   ??? Self-Exams Yes     Social History Narrative       Outpatient Prescriptions Marked as Taking for the 11/08/13 encounter (Office Visit) with Cheri Rous, MD   Medication Sig Dispense Refill   ??? multivitamin (ONE A DAY) tablet  Take 1 Tab by mouth daily.     ??? ranitidine (ZANTAC) 150 mg tablet Take 450 mg by mouth daily.     ??? ALPRAZolam (XANAX) 0.25 mg tablet Take 0.25 mg by mouth nightly as needed.     ??? ibuprofen (ADVIL) 200 mg tablet Take 600 mg by mouth every eight (8) hours as needed.     ??? cholecalciferol, vitamin D3, (VITAMIN D3) 2,000 unit tab Take 4,000 Units by mouth daily.         Allergies   Allergen Reactions   ??? Pcn [Penicillins] Angioedema       Filed Vitals:    11/08/13 1033   BP: 110/77   Pulse: 70   Temp: 96.7 ??F (35.9 ??C)   TempSrc: Oral   Resp: 20   Height: 5' 0.51" (1.537 m)   Weight: 124 lb (56.246 kg)   SpO2: 98%         Physical Examination: General appearance - alert, well appearing, and in no distress    Mental status - alert, oriented to person, place, and time  Eyes -  no redness, drainage, or jaundice  PULM: clear to auscultation, no wheezes, rales or rhonchi, symmetric air entry  CVS - normal rate, regular rhythm, normal S1, S2, no murmurs, rubs, clicks or gallops  GI - soft, nontender, nondistended, no masses or organomegaly ; normal bowel sounds; pt is noted to have burping in office on several occasions.  Back exam - full range of motion, no tenderness, palpable spasm or pain on motion  Neurological - alert, oriented, normal speech, no focal findings or movement disorder noted   Musculoskeletal - no joint tenderness, deformity or swelling   Extremities - no pedal edema noted, good pulses, normal color, temperature   Skin - normal coloration and turgor, no rashes noted or suspicious skin lesions noted on exposed surfaces    LABS/IMAGING:  No visits with results within 3 Month(s) from this visit.  Latest known visit with results is:    Office Visit on 04/09/2013   Component Date Value Ref Range Status   ??? Cholesterol, total 04/13/2013 197  100 - 199 mg/dL Final   ??? Triglyceride 04/13/2013 42  0 - 149 mg/dL Final   ??? HDL Cholesterol 04/13/2013 116  >39 mg/dL Final    Comment: According to ATP-III Guidelines, HDL-C >59 mg/dL is considered a  negative risk factor for CHD.     ??? VLDL, calculated 04/13/2013 8  5 - 40 mg/dL Final   ??? LDL, calculated 04/13/2013 73  0 - 99 mg/dL Final   ??? TSH-ICMA 04/13/2013 1.4   Final    Comment: Reference Range:  Pubertal Children and Adults: 0.5 - 4.8     ??? Free T4 04/13/2013 1.0   Final    Comment: Reference Range:  Pubertal Children and Adults: 0.8 - 1.7     ??? Glucose 04/13/2013 91  65 - 99 mg/dL Final   ??? BUN 04/13/2013 12  6 - 24 mg/dL Final   ??? Creatinine 04/13/2013 0.74  0.57 - 1.00 mg/dL Final   ??? GFR est non-AA 04/13/2013 92  >59 mL/min/1.73 Final   ??? GFR est AA 04/13/2013 106  >59 mL/min/1.73 Final   ??? BUN/Creatinine ratio 04/13/2013 16  9 - 23 Final   ??? Sodium 04/13/2013 141  134 - 144 mmol/L Final   ??? Potassium 04/13/2013 4.5  3.5 - 5.2 mmol/L Final   ??? Chloride 04/13/2013 100  97 - 108 mmol/L  Final   ??? CO2 04/13/2013 27  18 - 29 mmol/L Final   ??? Calcium 04/13/2013 9.4  8.7 - 10.2 mg/dL Final   ??? Protein, total 04/13/2013 6.8  6.0 - 8.5 g/dL Final   ??? Albumin 04/13/2013 4.3  3.5 - 5.5 g/dL Final   ??? GLOBULIN, TOTAL 04/13/2013 2.5  1.5 - 4.5 g/dL Final   ??? A-G Ratio 04/13/2013 1.7  1.1 - 2.5 Final   ??? Bilirubin, total 04/13/2013 0.5  0.0 - 1.2 mg/dL Final   ??? Alk. phosphatase 04/13/2013 79  39 - 117 IU/L Final   ??? AST 04/13/2013 17  0 - 40 IU/L Final   ??? ALT 04/13/2013 12  0 - 32 IU/L Final    ??? WBC 04/13/2013 4.6  3.4 - 10.8 x10E3/uL Final   ??? RBC 04/13/2013 4.80  3.77 - 5.28 x10E6/uL Final   ??? HGB 04/13/2013 14.4  11.1 - 15.9 g/dL Final   ??? HCT 04/13/2013 42.6  34.0 - 46.6 % Final   ??? MCV 04/13/2013 89  79 - 97 fL Final   ??? MCH 04/13/2013 30.0  26.6 - 33.0 pg Final   ??? MCHC 04/13/2013 33.8  31.5 - 35.7 g/dL Final   ??? RDW 04/13/2013 12.5  12.3 - 15.4 % Final   ??? PLATELET 04/13/2013 252  150 - 379 x10E3/uL Final   ??? NEUTROPHILS 04/13/2013 55   Final   ??? Lymphocytes 04/13/2013 28   Final   ??? MONOCYTES 04/13/2013 9   Final   ??? EOSINOPHILS 04/13/2013 7   Final   ??? BASOPHILS 04/13/2013 1   Final   ??? ABS. NEUTROPHILS 04/13/2013 2.5  1.4 - 7.0 x10E3/uL Final   ??? Abs Lymphocytes 04/13/2013 1.3  0.7 - 3.1 x10E3/uL Final   ??? ABS. MONOCYTES 04/13/2013 0.4  0.1 - 0.9 x10E3/uL Final   ??? ABS. EOSINOPHILS 04/13/2013 0.3  0.0 - 0.4 x10E3/uL Final   ??? ABS. BASOPHILS 04/13/2013 0.1  0.0 - 0.2 x10E3/uL Final   ??? IMMATURE GRANULOCYTES 04/13/2013 0   Final   ??? ABS. IMM. GRANS. 04/13/2013 0.0  0.0 - 0.1 x10E3/uL Final   ??? INTERPRETATION 04/13/2013 Note   Final    For Interpretations, please refer to [facility] CDS Patient Report.   ??? PDF IMAGE 04/13/2013 .   Final     Assessment/ Plan    Teauna was seen today for abdominal pain.    Diagnoses and associated orders for this visit:    Gastroesophageal reflux disease without esophagitis. Ecg: 63, regular rhythm, sinus rhythm, normal axis, ecg unchanged from Feb 2, 2-15 ecg. Change zantac to protonix.   - REFERRAL TO GASTROENTEROLOGY  - pantoprazole (PROTONIX) 20 mg tablet; Take 1 Tab by mouth daily. This replaces zantac Indications: GASTROESOPHAGEAL REFLUX  - AMB POC EKG ROUTINE W/ 12 LEADS, INTER & REP    Gassiness:  Splenic gas trapping. She is advised trial of gas x.  - REFERRAL TO GASTROENTEROLOGY  - AMB POC EKG ROUTINE W/ 12 LEADS, INTER & REP      Follow-up Disposition:  Return in about 1 month (around 12/08/2013), or if symptoms worsen or fail to improve.   I have reviewed return/Er/medication precautions and  discussed the diagnosis with the patient  and the intended plan as seen in the above orders.  The patient has been offered and/or received an after-visit summary and questions were answered concerning future plans. Patient verbalized understanding of all instruction.    Medication Side Effects  and Warnings were discussed with patient: yes    Jeannine Boga, MD

## 2013-11-08 NOTE — Progress Notes (Signed)
1. Have you been to the ER, urgent care clinic since your last visit?  Hospitalized since your last visit?no    2. Have you seen or consulted any other health care providers outside of the [facility] since your last visit?  Include any pap smears or colon screening. No    Patient Jessica Stanley is a 53 y.o. female presents today for stomach issues. Patient stated she ate something yesterday that she does not normally eat, gave her pain in the stomach.

## 2013-12-24 ENCOUNTER — Institutional Professional Consult (permissible substitution)
Admit: 2013-12-24 | Discharge: 2013-12-24 | Payer: PRIVATE HEALTH INSURANCE | Attending: Family Medicine | Primary: Family Medicine

## 2013-12-24 DIAGNOSIS — Z23 Encounter for immunization: Secondary | ICD-10-CM

## 2013-12-24 NOTE — Progress Notes (Signed)
Jessica Stanley is a 53 y.o. female presents to office for flu vaccine.

## 2014-01-24 ENCOUNTER — Encounter

## 2014-01-24 MED ORDER — PANTOPRAZOLE 20 MG TAB, DELAYED RELEASE
20 mg | ORAL_TABLET | Freq: Every day | ORAL | Status: DC
Start: 2014-01-24 — End: 2014-01-28

## 2014-01-24 NOTE — Telephone Encounter (Signed)
Dr. Christell ConstantMoore, please refill the following, thank you.    Requested Prescriptions     Pending Prescriptions Disp Refills   ??? pantoprazole (PROTONIX) 20 mg tablet 30 Tab 1     Sig: Take 1 Tab by mouth daily. This replaces zantax  Indications: GASTROESOPHAGEAL REFLUX

## 2014-01-24 NOTE — Telephone Encounter (Signed)
Pt is calling to request a refill on pantoprazole (PROTONIX) 20 mg tablet. Pt states this is working really well for her. Pharmacy:[facility] - [place], [place] - [address]. Please advise.

## 2014-01-28 ENCOUNTER — Encounter: Admit: 2014-01-28 | Primary: Family Medicine

## 2014-01-28 ENCOUNTER — Ambulatory Visit: Admit: 2014-01-28 | Payer: PRIVATE HEALTH INSURANCE | Attending: Family Medicine | Primary: Family Medicine

## 2014-01-28 DIAGNOSIS — M545 Low back pain, unspecified: Secondary | ICD-10-CM

## 2014-01-28 DIAGNOSIS — Z0000 Encounter for general adult medical examination without abnormal findings: Secondary | ICD-10-CM

## 2014-01-28 MED ORDER — PANTOPRAZOLE 20 MG TAB, DELAYED RELEASE
20 mg | ORAL_TABLET | Freq: Every day | ORAL | Status: DC
Start: 2014-01-28 — End: 2014-02-19

## 2014-01-28 NOTE — Progress Notes (Signed)
Quick Note:        Please send letter that her back xray shows mild degenerative changes (arthritis)    ______

## 2014-01-28 NOTE — Progress Notes (Signed)
Romie Minuslicia Cox Jelinek is a 53 y.o. female presents to office for CPE.     1. Have you been to the ER, urgent care clinic or hospitalized since your last visit? No   2. Have [facility] any other providers [facility] since your last visit? Yes, [facility]

## 2014-01-28 NOTE — Patient Instructions (Signed)
Well Visit, Women 50 to 65: After Your Visit  Your Care Instructions  Physical exams can help you stay healthy. Your doctor has checked your overall health and may have suggested ways to take good care of yourself. He or she also may have recommended tests. At home, you can help prevent illness with healthy eating, regular exercise, and other steps.  Follow-up care is a key part of your treatment and safety. Be sure to make and go to all appointments, and call your doctor if you are having problems. It's also a good idea to know your test results and keep a list of the medicines you take.  How can you care for yourself at home?  ?? Reach and stay at a healthy weight. This will lower your risk for many problems, such as obesity, diabetes, heart disease, and high blood pressure.  ?? Get at least 30 minutes of exercise on most days of the week. Walking is a good choice. You also may want to do other activities, such as running, swimming, cycling, or playing tennis or team sports.  ?? Do not smoke. Smoking can make health problems worse. If you need help quitting, talk to your doctor about stop-smoking programs and medicines. These can increase your chances of quitting for good.  ?? Always wear sunscreen on exposed skin. Make sure to use a broad-spectrum sunscreen that has a sun protection factor (SPF) of 30 or higher. Use it every day, even when it is cloudy.  ?? See a dentist one or two times a year for checkups and to have your teeth cleaned.  ?? Wear a seat belt in the car.  ?? Limit alcohol to 1 drink a day. Too much alcohol can cause health problems.  Follow your doctor's advice about when to have certain tests. These tests can spot problems early.  ?? Cholesterol. Your doctor will tell you how often to have this done based on your age, family history, or other things that can increase your risk for heart attack and stroke.  ?? Blood pressure. You will likely have your blood pressure checked at any  visit to your doctor. If you are healthy and have a blood pressure below 120/80 mm Hg, have your blood pressure checked at least every 1 to 2 years. This can be done during a routine doctor visit. If you have slightly higher or high blood pressure, or are at risk for heart disease, your doctor will suggest more frequent tests.  ?? Mammogram. Ask your doctor how often you should have a mammogram, which is an X-ray of your breasts. A mammogram can spot breast cancer before it can be felt and when it is easiest to treat.  ?? Pap test and pelvic exam. Ask your doctor how often you should have a Pap test. You may not need to have a Pap test as often as you used to.  ?? Vision. Have your eyes checked every year or two or as often as your doctor suggests. Some experts recommend that you have yearly exams for glaucoma and other age-related eye problems starting at age 55.  ?? Hearing. Tell your doctor if you notice any change in your hearing. You can have tests to find out how well you hear.  ?? Diabetes. Ask your doctor whether you should have tests for diabetes.  ?? Colon cancer. You should begin tests for colon cancer at age 55. You may have one of several tests. Your doctor will tell you how often to   have tests based on your age and risk. Risks include whether you already had a precancerous polyp removed from your colon or whether your parents, sisters and brothers, or children have had colon cancer.  ?? Thyroid disease. Talk to your doctor about whether to have your thyroid checked as part of a regular physical exam. Women have an increased chance of a thyroid problem.  ?? Osteoporosis. You should begin tests for bone density at age 55. If you are younger than 65, ask your doctor whether you have factors that may increase your risk for this disease. You may want to have this test before age 65.  ?? Heart attack and stroke risk. At least every 4 to 6 years, you should  have your risk for heart attack and stroke assessed. Your doctor uses factors such as your age, blood pressure, cholesterol, and whether you smoke or have diabetes to show what your risk for a heart attack or stroke is over the next 10 years.  When should you call for help?  Watch closely for changes in your health, and be sure to contact your doctor if you have any problems or symptoms that concern you.   Where can you learn more?   Go to http://www.healthwise.net/[facility]  Enter Y074 in the search box to learn more about "Well Visit, Women 50 to 65: After Your Visit."   ?? 2006-2015 Healthwise, Incorporated. Care instructions adapted under license by [place] (which disclaims liability or warranty for this information). This care instruction is for use with your licensed healthcare professional. If you have questions about a medical condition or this instruction, always ask your healthcare professional. Healthwise, Incorporated disclaims any warranty or liability for your use of this information.  Content Version: 10.5.422740; Current as of: April 27, 2013              Dual-Energy X-Ray Absorptiometry (DXA) Test: About This Test  What is it?  Dual-energy X-ray absorptiometry (DXA) is a test that uses two different X-ray beams to check bone thickness (density) in your spine and hip. This information is used to estimate the strength of your bones.  Why is this test done?  A DXA test is done to check for bone thinning and weakness, which can make it easier for you to break a bone. It is often done for:  ?? People who are at risk for osteoporosis, including:  ?? Women who are age 55 and older.  ?? Older men.  ?? People who take some medications, such as corticosteroids.  ?? People who have certain medical conditions, such as hyperparathyroidism.  ?? People who have osteoporosis, to see how well treatment is working.  What happens before the test?   ?? Women who are pregnant should not have this test. Let your doctor know if you are or might be pregnant.  ?? You may be able to leave your clothes on for the test, but you will remove any metal buttons or buckles for the test.  What happens during the test?  ?? You will lie down on your back on a padded table.  ?? You may need to lie with your legs straight or with your lower legs resting on a platform built into the table.  ?? The machine will scan your bones and measure the amount of radiation they absorb. During this test you are exposed to a very low dose of radiation.  How long does the test take?  ?? The test will take about 20   minutes.  What happens after the test?  ?? You will probably be able to go home right away.  ?? You can go back to your usual activities right away.  Follow-up care is a key part of your treatment and safety. Be sure to make and go to all appointments, and call your doctor if you are having problems. It's also a good idea to keep a list of the medicines you take. Ask your doctor when you can expect to have your test results.   Where can you learn more?   Go to http://www.healthwise.net/[facility]  Enter L563 in the search box to learn more about "Dual-Energy X-Ray Absorptiometry (DXA) Test: About This Test."   ?? 2006-2015 Healthwise, Incorporated. Care instructions adapted under license by [place] (which disclaims liability or warranty for this information). This care instruction is for use with your licensed healthcare professional. If you have questions about a medical condition or this instruction, always ask your healthcare professional. Healthwise, Incorporated disclaims any warranty or liability for your use of this information.  Content Version: 10.5.422740; Current as of: January 19, 2013

## 2014-01-28 NOTE — Progress Notes (Signed)
HPI  Jessica Stanley is a 53 y.o. female that presents with  Chief Complaint   Patient presents with   ??? Complete Physical     She overall is doing well. She has had recent pap smear and mammogram through her gynecologist. She gets routine dental care and has recently had eye exam. No change in glasses needed.     She reports the protonix worked great to resolved her reflux. She has improved her eating patters. She has followed up with her gi provider for h/o polyp removed last year.Her last full colonoscopy was 2 years ago. She is getting a flex sig soon and also upper endoscopy for her long h/o gerd.    She reports over a year of mild low back pain, localized to midline in a specific area. No trauma. She has never had bone density test. She is postmenopausal.    Review of Systems:  ROS:  History obtained from the patient  ?? General: negative for - chills, fever,  ?? HEENT: no sore throat, nasal congestion,  vision problems or ear problems  ?? Respiratory: no cough, shortness of breath, or wheezing  ?? Cardiovascular: no chest pain, palpitations, or dyspnea on exertion  ?? Gastrointestinal: see hpi.  ?? Genitourinary:  No dysuria or hematuria  ?? Musculoskeletal: see hpi  ?? Neurological: no numbness, tingling, headache  reported      Past Medical History   Diagnosis Date   ??? Anxiety        Family History   Problem Relation Age of Onset   ??? Cancer Mother      breast   ??? Stroke Father    ??? Heart Disease Father    ??? Hypertension Father    ??? Heart Disease Maternal Grandmother    ??? Heart Failure Maternal Grandmother    ??? Heart Disease Maternal Grandfather    ??? No Known Problems Paternal Grandmother    ??? Heart Attack Paternal Grandfather        Past Surgical History   Procedure Laterality Date   ??? Hx colonoscopy     ??? Hx appendectomy     ??? Hx partial hysterectomy       Removal of both fallopian tubes and one ovary.   ??? Hx cesarean section     ??? Hx other surgical       Dental implants   ??? Hx orthopaedic  1982      Broken right hand       History     Social History   ??? Marital Status: MARRIED     Spouse Name: N/A     Number of Children: N/A   ??? Years of Education: N/A     Occupational History   ??? Not on file.     Social History Main Topics   ??? Smoking status: Former Smoker -- 1.00 packs/day for 15 years     Types: Cigarettes     Start date: 04/09/1978     Quit date: 04/09/1993   ??? Smokeless tobacco: Never Used   ??? Alcohol Use: Yes      Comment: 2 glasses of wine a day   ??? Drug Use: No   ??? Sexual Activity:     Partners: Male     Patent examiner Protection: None     Other Topics Concern   ??? Military Service No   ??? Blood Transfusions No   ??? Caffeine Concern No   ??? Occupational Exposure No   ???  Hobby Hazards No   ??? Sleep Concern No   ??? Stress Concern Yes     Job related   ??? Weight Concern No   ??? Special Diet No   ??? Back Care Yes   ??? Exercise Yes   ??? Seat Belt Yes   ??? Self-Exams Yes     Social History Narrative       Outpatient Prescriptions Marked as Taking for the 01/28/14 encounter (Office Visit) with Cheri Rous, MD   Medication Sig Dispense Refill   ??? pantoprazole (PROTONIX) 20 mg tablet Take 1 Tab by mouth daily. Indications: GASTROESOPHAGEAL REFLUX 90 Tab 1   ??? multivitamin (ONE A DAY) tablet Take 1 Tab by mouth daily.     ??? ALPRAZolam (XANAX) 0.25 mg tablet Take 0.25 mg by mouth nightly as needed.     ??? ibuprofen (ADVIL) 200 mg tablet Take 600 mg by mouth every eight (8) hours as needed.     ??? cholecalciferol, vitamin D3, (VITAMIN D3) 2,000 unit tab Take 4,000 Units by mouth daily.         Allergies   Allergen Reactions   ??? Pcn [Penicillins] Angioedema       Filed Vitals:    01/28/14 1352   BP: 111/62   Pulse: 71   Temp: 97.1 ??F (36.2 ??C)   TempSrc: Oral   Resp: 12   Height: 5' 0.5" (1.537 m)   Weight: 126 lb 3.2 oz (57.244 kg)   SpO2: 98%         Physical Examination: General appearance - alert, well appearing, and in no distress    Mental status - alert, oriented to person, place, and time   Eyes - pupils equal and reactive, extraocular eye movements intact; no redness, drainage, or jaundice  Ears - bilateral TM's and external ear canals normal  Nose - normal and patent, no erythema or discharge , sinuses normal and nontender   Mouth - mucous membranes moist, pharynx normal without lesions; no quinsy, stridor, swelling, or drooling   Neck - supple, nontender with no adenopathy  PULM: clear to auscultation, no wheezes, rales or rhonchi, symmetric air entry  CVS - normal rate, regular rhythm, normal S1, S2, no murmurs, rubs, clicks or gallops  GI - soft, nontender, nondistended, no masses or organomegaly   Back exam - upper low back midline localized tenderness. No overlying redness or swelling.   Neurological - alert, oriented, normal speech, no focal findings or movement disorder noted   Musculoskeletal - no joint tenderness, deformity or swelling  Extremities - no pedal edema noted, good pulses, normal color, temperature   Skin - normal coloration and turgor, no rashes noted or suspicious skin lesions noted on exposed surfaces    LABS/IMAGING:  No visits with results within 3 Month(s) from this visit.  Latest known visit with results is:    Office Visit on 04/09/2013   Component Date Value Ref Range Status   ??? Cholesterol, total 04/13/2013 197  100 - 199 mg/dL Final   ??? Triglyceride 04/13/2013 42  0 - 149 mg/dL Final   ??? HDL Cholesterol 04/13/2013 116  >39 mg/dL Final    Comment: According to ATP-III Guidelines, HDL-C >59 mg/dL is considered a  negative risk factor for CHD.     ??? VLDL, calculated 04/13/2013 8  5 - 40 mg/dL Final   ??? LDL, calculated 04/13/2013 73  0 - 99 mg/dL Final   ??? TSH-ICMA 04/13/2013 1.4   Final  Comment: Reference Range:  Pubertal Children and Adults: 0.5 - 4.8     ??? Free T4 04/13/2013 1.0   Final    Comment: Reference Range:  Pubertal Children and Adults: 0.8 - 1.7     ??? Glucose 04/13/2013 91  65 - 99 mg/dL Final   ??? BUN 04/13/2013 12  6 - 24 mg/dL Final    ??? Creatinine 04/13/2013 0.74  0.57 - 1.00 mg/dL Final   ??? GFR est non-AA 04/13/2013 92  >59 mL/min/1.73 Final   ??? GFR est AA 04/13/2013 106  >59 mL/min/1.73 Final   ??? BUN/Creatinine ratio 04/13/2013 16  9 - 23 Final   ??? Sodium 04/13/2013 141  134 - 144 mmol/L Final   ??? Potassium 04/13/2013 4.5  3.5 - 5.2 mmol/L Final   ??? Chloride 04/13/2013 100  97 - 108 mmol/L Final   ??? CO2 04/13/2013 27  18 - 29 mmol/L Final   ??? Calcium 04/13/2013 9.4  8.7 - 10.2 mg/dL Final   ??? Protein, total 04/13/2013 6.8  6.0 - 8.5 g/dL Final   ??? Albumin 04/13/2013 4.3  3.5 - 5.5 g/dL Final   ??? GLOBULIN, TOTAL 04/13/2013 2.5  1.5 - 4.5 g/dL Final   ??? A-G Ratio 04/13/2013 1.7  1.1 - 2.5 Final   ??? Bilirubin, total 04/13/2013 0.5  0.0 - 1.2 mg/dL Final   ??? Alk. phosphatase 04/13/2013 79  39 - 117 IU/L Final   ??? AST 04/13/2013 17  0 - 40 IU/L Final   ??? ALT 04/13/2013 12  0 - 32 IU/L Final   ??? WBC 04/13/2013 4.6  3.4 - 10.8 x10E3/uL Final   ??? RBC 04/13/2013 4.80  3.77 - 5.28 x10E6/uL Final   ??? HGB 04/13/2013 14.4  11.1 - 15.9 g/dL Final   ??? HCT 04/13/2013 42.6  34.0 - 46.6 % Final   ??? MCV 04/13/2013 89  79 - 97 fL Final   ??? MCH 04/13/2013 30.0  26.6 - 33.0 pg Final   ??? MCHC 04/13/2013 33.8  31.5 - 35.7 g/dL Final   ??? RDW 04/13/2013 12.5  12.3 - 15.4 % Final   ??? PLATELET 04/13/2013 252  150 - 379 x10E3/uL Final   ??? NEUTROPHILS 04/13/2013 55   Final   ??? Lymphocytes 04/13/2013 28   Final   ??? MONOCYTES 04/13/2013 9   Final   ??? EOSINOPHILS 04/13/2013 7   Final   ??? BASOPHILS 04/13/2013 1   Final   ??? ABS. NEUTROPHILS 04/13/2013 2.5  1.4 - 7.0 x10E3/uL Final   ??? Abs Lymphocytes 04/13/2013 1.3  0.7 - 3.1 x10E3/uL Final   ??? ABS. MONOCYTES 04/13/2013 0.4  0.1 - 0.9 x10E3/uL Final   ??? ABS. EOSINOPHILS 04/13/2013 0.3  0.0 - 0.4 x10E3/uL Final   ??? ABS. BASOPHILS 04/13/2013 0.1  0.0 - 0.2 x10E3/uL Final   ??? IMMATURE GRANULOCYTES 04/13/2013 0   Final   ??? ABS. IMM. GRANS. 04/13/2013 0.0  0.0 - 0.1 x10E3/uL Final   ??? INTERPRETATION 04/13/2013 Note   Final     For Interpretations, please refer to [facility] CDS Patient Report.   ??? PDF IMAGE 04/13/2013 .   Final     Assessment/ Plan    Stepheni was seen today for complete physical.    Diagnoses and all orders for this visit:    Gastroesophageal reflux disease without esophagitis. Getting egd reportedly, next year.  Orders:  -     pantoprazole (PROTONIX) 20 mg tablet; Take 1  Tab by mouth daily. Indications: GASTROESOPHAGEAL REFLUX    Encounter for routine history and physical examination of adult: labs back in February wnl.    Midline low back pain without sciatica  Orders:  -     XR SPINE LUMB 2 OR 3 V; Future    Postmenopausal  Orders:  -     DEXA BONE DENSITY STUDY AXIAL; Future    Screening for osteoporosis  Orders:  -     DEXA BONE DENSITY STUDY AXIAL; Future         Follow-up Disposition:  Return in about 1 year (around 01/29/2015),  for annual physical/wellness exam, or if symptoms worsen or fail to improve. Will call for actionable test results after received.  I have reviewed return/Er/medication precautions and  discussed the diagnosis with the patient and the intended plan as seen in the above orders.  The patient has been offered and/or received an after-visit summary and questions were answered concerning future plans. Patient verbalized understanding of all instruction.    Jeannine Boga, MD

## 2014-02-12 ENCOUNTER — Ambulatory Visit: Admit: 2014-02-12 | Payer: PRIVATE HEALTH INSURANCE | Attending: Family Medicine | Primary: Family Medicine

## 2014-02-12 ENCOUNTER — Encounter: Admit: 2014-02-12 | Primary: Family Medicine

## 2014-02-12 ENCOUNTER — Encounter: Attending: Family Medicine | Primary: Family Medicine

## 2014-02-12 DIAGNOSIS — R059 Cough, unspecified: Secondary | ICD-10-CM

## 2014-02-12 DIAGNOSIS — J209 Acute bronchitis, unspecified: Secondary | ICD-10-CM

## 2014-02-12 LAB — AMB POC RAPID INFLUENZA TEST: QuickVue Influenza test: NEGATIVE

## 2014-02-12 MED ORDER — AZITHROMYCIN 250 MG TAB
250 mg | ORAL | Status: AC
Start: 2014-02-12 — End: 2014-02-17

## 2014-02-12 MED ORDER — ALBUTEROL SULFATE HFA 90 MCG/ACTUATION AEROSOL INHALER
90 mcg/actuation | RESPIRATORY_TRACT | Status: DC | PRN
Start: 2014-02-12 — End: 2015-07-22

## 2014-02-12 MED ORDER — HYDROCODONE 10 MG-CHLORPHENIRAMINE 8 MG/5 ML ORAL SUSP EXTEND.REL 12HR
10-8 mg/5 mL | Freq: Two times a day (BID) | ORAL | Status: DC | PRN
Start: 2014-02-12 — End: 2014-04-10

## 2014-02-12 NOTE — Progress Notes (Signed)
Jessica Stanley is a 53 y.o. female presents in office for complaints of chest congestion, nasal drainage and coughing. Patient reports symptoms began Saturday. Patient reports having pneumonia each year. Patient reports using several OTC symptom relievers with no effectiveness in symptom relief.           1. Have you been to the ER, urgent care clinic since your last visit?  Hospitalized since your last visit?No    2. Have you seen or consulted any other health care providers outside of [facility] since your last visit?  Include any pap smears or colon screening. No

## 2014-02-12 NOTE — Patient Instructions (Addendum)
Cough: After Your Visit  Your Care Instructions  A cough is your body's response to something that bothers your throat or airways. Many things can cause a cough. You might cough because of a cold or the flu, bronchitis, or asthma. Smoking, postnasal drip, allergies, and stomach acid that backs up into your throat also can cause coughs.  A cough is a symptom, not a disease. Most coughs stop when the cause, such as a cold, goes away. You can take a few steps at home to cough less and feel better.  Follow-up care is a key part of your treatment and safety. Be sure to make and go to all appointments, and call your doctor if you are having problems. It's also a good idea to know your test results and keep a list of the medicines you take.  How can you care for yourself at home?  ?? Drink lots of water and other fluids. This helps thin the mucus and soothes a dry or sore throat. Honey or lemon juice in hot water or tea may ease a dry cough.  ?? Take cough medicine as directed by your doctor.  ?? Prop up your head on pillows to help you breathe and ease a dry cough.  ?? Try cough drops to soothe a dry or sore throat. Cough drops don't stop a cough. Medicine-flavored cough drops are no better than candy-flavored drops or hard candy.  ?? Do not smoke. Avoid secondhand smoke. If you need help quitting, talk to your doctor about stop-smoking programs and medicines. These can increase your chances of quitting for good.  When should you call for help?  Call 911 anytime you think you may need emergency care. For example, call if:  ?? You have severe trouble breathing.  Call your doctor now or seek immediate medical care if:  ?? You cough up blood.  ?? You have new or worse trouble breathing.  ?? You have a new or higher fever.  ?? You have a new rash.  Watch closely for changes in your health, and be sure to contact your doctor if:  ?? You cough more deeply or more often, especially if you notice more mucus  or a change in the color of your mucus.  ?? You have new symptoms, such as a sore throat, an earache, or sinus pain.  ?? You do not get better as expected.   Where can you learn more?   Go to http://www.healthwise.net/[facility]  Enter D279 in the search box to learn more about "Cough: After Your Visit."   ?? 2006-2015 Healthwise, Incorporated. Care instructions adapted under license by [place] (which disclaims liability or warranty for this information). This care instruction is for use with your licensed healthcare professional. If you have questions about a medical condition or this instruction, always ask your healthcare professional. Healthwise, Incorporated disclaims any warranty or liability for your use of this information.  Content Version: 10.5.422740; Current as of: November 14, 2012              Bronchitis: After Your Visit  Your Care Instructions     Bronchitis is inflammation of the bronchial tubes, which carry air to the lungs. The tubes swell and produce mucus, or phlegm. The mucus and inflamed bronchial tubes make you cough. You may have trouble breathing.  Most cases of bronchitis are caused by viruses like those that cause colds. Antibiotics usually do not help and they may be harmful.  Bronchitis usually develops rapidly   and lasts about 2 to 3 weeks in otherwise healthy people.  Follow-up care is a key part of your treatment and safety. Be sure to make and go to all appointments, and call your doctor if you are having problems. It's also a good idea to know your test results and keep a list of the medicines you take.  How can you care for yourself at home?  ?? Take all medicines exactly as prescribed. Call your doctor if you think you are having a problem with your medicine.  ?? Get some extra rest.  ?? Take an over-the-counter pain medicine, such as acetaminophen (Tylenol), ibuprofen (Advil, Motrin), or naproxen (Aleve) to reduce fever and relieve  body aches. Read and follow all instructions on the label.  ?? Do not take two or more pain medicines at the same time unless the doctor told you to. Many pain medicines have acetaminophen, which is Tylenol. Too much acetaminophen (Tylenol) can be harmful.  ?? Take an over-the-counter cough medicine that contains dextromethorphan to help quiet a dry, hacking cough so that you can sleep. Avoid cough medicines that have more than one active ingredient. Read and follow all instructions on the label.  ?? Breathe moist air from a humidifier, hot shower, or sink filled with hot water. The heat and moisture will thin mucus so you can cough it out.  ?? Do not smoke. Smoking can make bronchitis worse. If you need help quitting, talk to your doctor about stop-smoking programs and medicines. These can increase your chances of quitting for good.  When should you call for help?  Call 911 anytime you think you may need emergency care. For example, call if:  ?? You have severe trouble breathing.  Call your doctor now or seek immediate medical care if:  ?? You have new or worse trouble breathing.  ?? You cough up dark brown or bloody mucus (sputum).  ?? You have a new or higher fever.  ?? You have a new rash.  Watch closely for changes in your health, and be sure to contact your doctor if:  ?? You cough more deeply or more often, especially if you notice more mucus or a change in the color of your mucus.  ?? You are not getting better as expected.   Where can you learn more?   Go to http://www.healthwise.net/[facility]  Enter H333 in the search box to learn more about "Bronchitis: After Your Visit."   ?? 2006-2015 Healthwise, Incorporated. Care instructions adapted under license by [place] (which disclaims liability or warranty for this information). This care instruction is for use with your licensed healthcare professional. If you have questions about a medical condition  or this instruction, always ask your healthcare professional. Healthwise, Incorporated disclaims any warranty or liability for your use of this information.  Content Version: 10.5.422740; Current as of: November 14, 2012

## 2014-02-12 NOTE — Progress Notes (Signed)
HPI  Jessica Stanley is a 53 y.o. female that presents with  Chief Complaint   Patient presents with   ??? URI     She reports sore throat, coughing, nasal congestion, since the weekend. Has been using her inhaler along with otc cold med, since she has been having chest tightness. She felt subjective fever also recently with some sweats. Some body aches. She reports h/o pneumonia twice over past couple of years. Her gerd remains controlled. She used afrin nasal spray also. No n/v or diarrhea.    Review of Systems:  ROS:  History obtained from the patient  See hpi.    Past Medical History   Diagnosis Date   ??? Anxiety        Family History   Problem Relation Age of Onset   ??? Cancer Mother      breast   ??? Stroke Father    ??? Heart Disease Father    ??? Hypertension Father    ??? Heart Disease Maternal Grandmother    ??? Heart Failure Maternal Grandmother    ??? Heart Disease Maternal Grandfather    ??? No Known Problems Paternal Grandmother    ??? Heart Attack Paternal Grandfather        Past Surgical History   Procedure Laterality Date   ??? Hx colonoscopy     ??? Hx appendectomy     ??? Hx partial hysterectomy       Removal of both fallopian tubes and one ovary.   ??? Hx cesarean section     ??? Hx other surgical       Dental implants   ??? Hx orthopaedic  1982     Broken right hand       History     Social History   ??? Marital Status: MARRIED     Spouse Name: N/A     Number of Children: N/A   ??? Years of Education: N/A     Occupational History   ??? Not on file.     Social History Main Topics   ??? Smoking status: Former Smoker -- 1.00 packs/day for 15 years     Types: Cigarettes     Start date: 04/09/1978     Quit date: 04/09/1993   ??? Smokeless tobacco: Never Used   ??? Alcohol Use: Yes      Comment: 2 glasses of wine a day   ??? Drug Use: No   ??? Sexual Activity:     Partners: Male     Pharmacist, hospitalBirth Control/ Protection: None     Other Topics Concern   ??? Military Service No   ??? Blood Transfusions No   ??? Caffeine Concern No   ??? Occupational Exposure No    ??? Hobby Hazards No   ??? Sleep Concern No   ??? Stress Concern Yes     Job related   ??? Weight Concern No   ??? Special Diet No   ??? Back Care Yes   ??? Exercise Yes   ??? Seat Belt Yes   ??? Self-Exams Yes     Social History Narrative       Outpatient Prescriptions Marked as Taking for the 02/12/14 encounter (Office Visit) with Penelope GalasLynnette Emmakate Hypes, MD   Medication Sig Dispense Refill   ??? albuterol (PROVENTIL HFA, VENTOLIN HFA, PROAIR HFA) 90 mcg/actuation inhaler Take 2 Puffs by inhalation every four (4) hours as needed for Wheezing. 1 Inhaler 1   ??? pantoprazole (PROTONIX) 20 mg tablet Take  1 Tab by mouth daily. Indications: GASTROESOPHAGEAL REFLUX 90 Tab 1   ??? multivitamin (ONE A DAY) tablet Take 1 Tab by mouth daily.     ??? ALPRAZolam (XANAX) 0.25 mg tablet Take 0.25 mg by mouth nightly as needed.     ??? ibuprofen (ADVIL) 200 mg tablet Take 600 mg by mouth every eight (8) hours as needed.     ??? cholecalciferol, vitamin D3, (VITAMIN D3) 2,000 unit tab Take 4,000 Units by mouth daily.         Allergies   Allergen Reactions   ??? Pcn [Penicillins] Angioedema       Filed Vitals:    02/12/14 1525   BP: 108/72   Pulse: 82   Temp: 97.3 ??F (36.3 ??C)   TempSrc: Oral   Resp: 16   Height: 5' 0.51" (1.537 m)   Weight: 125 lb 3.2 oz (56.79 kg)   SpO2: 98%         Physical Examination: General appearance - alert, nontoxic, and in no acute distress    Mental status - alert, oriented to person, place, and time  Eyes - pupils equal and reactive, extraocular eye movements intact; no redness, drainage, or jaundice  Ears - bilateral TM's and external ear canals normal  Nose - congested  Mouth - mucous membranes moist, pharynx normal without lesions; no quinsy, stridor, swelling, or drooling   Neck - supple,tender submandibular region with no adenopathy  PULM: coughing noted, prolonged expiratory phase, forced end expiratory wheeze. No accessory muscle use.  CVS - normal rate, regular rhythm, normal S1, S2, no murmurs, rubs, clicks or gallops   GI - soft, nontender, nondistended, no masses or organomegaly   Neurological - alert, oriented, normal speech, no focal findings or movement disorder noted       Assessment/ Plan    Tearsa was seen today for uri.    Diagnoses and all orders for this visit:    Acute bronchitis, unspecified organism. Her albuterol is noted to be expired.  Orders:  -     XR CHEST PA LAT; Future: no infiltrate noted; await official xray.  -     AMB POC RAPID INFLUENZA TEST: negative  -     azithromycin (ZITHROMAX) 250 mg tablet; Take 2 tablets today, then take 1 tablet daily  -     chlorpheniramine-HYDROcodone (TUSSIONEX) 10-8 mg/5 mL suspension; Take 5 mL by mouth every twelve (12) hours as needed for Cough. Max Daily Amount: 10 mL. Drowsiness precautions. Do not drive after taking.  -     albuterol (PROVENTIL HFA, VENTOLIN HFA, PROAIR HFA) 90 mcg/actuation inhaler; Take 2 Puffs by inhalation every four (4) hours as needed for Wheezing.    Coughing  Orders:  -     XR CHEST PA LAT; Future  -     AMB POC RAPID INFLUENZA TEST  -     azithromycin (ZITHROMAX) 250 mg tablet; Take 2 tablets today, then take 1 tablet daily  -     chlorpheniramine-HYDROcodone (TUSSIONEX) 10-8 mg/5 mL suspension; Take 5 mL by mouth every twelve (12) hours as needed for Cough. Max Daily Amount: 10 mL. Drowsiness precautions. Do not drive after taking.  -     albuterol (PROVENTIL HFA, VENTOLIN HFA, PROAIR HFA) 90 mcg/actuation inhaler; Take 2 Puffs by inhalation every four (4) hours as needed for Wheezing.         Follow-up Disposition:  Return if symptoms worsen or fail to improve.  I have reviewed return/Er/medication  precautions and  discussed the diagnosis with the patient   and the intended plan as seen in the above orders.  The patient has been offered and/or received an after-visit summary and questions were answered concerning future plans. Patient verbalized understanding of all instruction.     Medication Side Effects and Warnings were discussed with patient: yes  Patient Labs were reviewed: yes    Darene LamerLYNNETTE M Malisha Mabey, MD

## 2014-02-13 NOTE — Progress Notes (Signed)
Quick Note:        Please advise pt official cxr result was normal.    ______

## 2014-02-15 NOTE — Telephone Encounter (Addendum)
Spoke with patient regarding xray result, patient stated understanding. Patient stated that she does not want to received phone calls from the office other than in her mobile number. Her other number was updated both in [facility] and epic. Closing encounter.      ----- Message from Penelope GalasLynnette Moore, MD sent at 02/13/2014  6:24 PM EST -----  Please advise pt official cxr result was normal.

## 2014-02-19 ENCOUNTER — Encounter

## 2014-02-19 MED ORDER — PANTOPRAZOLE 20 MG TAB, DELAYED RELEASE
20 mg | ORAL_TABLET | Freq: Every day | ORAL | Status: DC
Start: 2014-02-19 — End: 2014-08-19

## 2014-02-19 NOTE — Telephone Encounter (Signed)
Rec'd inbound fax requesting a  Medication refill on the following medication to mail order pharamcy   Requested Prescriptions     Pending Prescriptions Disp Refills   ??? pantoprazole (PROTONIX) 20 mg tablet 90 Tab 1     Sig: Take 1 Tab by mouth daily. Indications: GASTROESOPHAGEAL REFLUX

## 2014-04-10 ENCOUNTER — Encounter

## 2014-04-10 ENCOUNTER — Ambulatory Visit
Admit: 2014-04-10 | Discharge: 2014-04-10 | Payer: PRIVATE HEALTH INSURANCE | Attending: Family Medicine | Primary: Family Medicine

## 2014-04-10 DIAGNOSIS — R5383 Other fatigue: Secondary | ICD-10-CM

## 2014-04-10 LAB — AMB POC URINALYSIS DIP STICK AUTO W/O MICRO
Bilirubin (UA POC): NEGATIVE
Blood (UA POC): NEGATIVE
Glucose (UA POC): NEGATIVE
Ketones (UA POC): NEGATIVE
Nitrites (UA POC): NEGATIVE
Protein (UA POC): NEGATIVE mg/dL
Specific gravity (UA POC): 1.015 (ref 1.001–1.035)
Urobilinogen (UA POC): 0.2 (ref 0.2–1)
pH (UA POC): 8.5 — AB (ref 4.6–8.0)

## 2014-04-10 NOTE — Progress Notes (Signed)
HPI  Jessica Stanley is a 54 y.o. female that presents with  Chief Complaint   Patient presents with   ??? Fatigue     Noted fatigue over past couple of weeks. Reports a midday nap helps refresh. She admits to a hectic life with work, and family health issues with parents. She wanted to have check up since her gi provider is planning a rectal procedure to remove a flat polyp, which was potentially precancerous,  under anesthesia soon. She is due to get preprocedure labs, but does not know exactly what tests. She has had similar rectal procedue aprox 2 years ago. No uri or flu symptoms.    No dizziness, chest pain, sob, sore throat, fevers, body aches. She is physically active, eats healthy.     She has chronic urinary frequency but does not feels its a uti as no pain.      Review of Systems:  ROS:  History obtained from the patient  ?? General: negative for - chills, fever  ?? HEENT: no sore throat  ?? Respiratory: no cough, shortness of breath, or wheezing  ?? Cardiovascular: no chest pain, palpitations, or dyspnea on exertion  ?? Gastrointestinal: no abdominal pain, N/V, gerd is controlled  ?? Genitourinary:  frequency  ?? Musculoskeletal: no back pain, joint pain, joint stiffness, muscle pain or muscle weakness  ?? Neurological: no numbness, tingling, headache or dizziness, or seizures      Past Medical History   Diagnosis Date   ??? Anxiety        Family History   Problem Relation Age of Onset   ??? Cancer Mother      breast   ??? Stroke Father    ??? Heart Disease Father    ??? Hypertension Father    ??? Heart Disease Maternal Grandmother    ??? Heart Failure Maternal Grandmother    ??? Heart Disease Maternal Grandfather    ??? No Known Problems Paternal Grandmother    ??? Heart Attack Paternal Grandfather        Past Surgical History   Procedure Laterality Date   ??? Hx colonoscopy     ??? Hx appendectomy     ??? Hx partial hysterectomy       Removal of both fallopian tubes and one ovary.   ??? Hx cesarean section     ??? Hx other surgical        Dental implants   ??? Hx orthopaedic  1982     Broken right hand       History     Social History   ??? Marital Status: MARRIED     Spouse Name: N/A     Number of Children: N/A   ??? Years of Education: N/A     Occupational History   ??? Not on file.     Social History Main Topics   ??? Smoking status: Former Smoker -- 1.00 packs/day for 15 years     Types: Cigarettes     Start date: 04/09/1978     Quit date: 04/09/1993   ??? Smokeless tobacco: Never Used   ??? Alcohol Use: Yes      Comment: 2 glasses of wine a day   ??? Drug Use: No   ??? Sexual Activity:     Partners: Male     Pharmacist, hospital Protection: None     Other Topics Concern   ??? Military Service No   ??? Blood Transfusions No   ??? Caffeine Concern No   ???  Occupational Exposure No   ??? Hobby Hazards No   ??? Sleep Concern No   ??? Stress Concern Yes     Job related   ??? Weight Concern No   ??? Special Diet No   ??? Back Care Yes   ??? Exercise Yes   ??? Seat Belt Yes   ??? Self-Exams Yes     Social History Narrative       Outpatient Prescriptions Marked as Taking for the 04/10/14 encounter (Office Visit) with Penelope GalasLynnette Youssouf Shipley, MD   Medication Sig Dispense Refill   ??? pantoprazole (PROTONIX) 20 mg tablet Take 1 Tab by mouth daily. Indications: GASTROESOPHAGEAL REFLUX 90 Tab 1   ??? multivitamin (ONE A DAY) tablet Take 1 Tab by mouth daily.     ??? ALPRAZolam (XANAX) 0.25 mg tablet Take 0.25 mg by mouth nightly as needed.     ??? ibuprofen (ADVIL) 200 mg tablet Take 600 mg by mouth every eight (8) hours as needed.     ??? cholecalciferol, vitamin D3, (VITAMIN D3) 2,000 unit tab Take 4,000 Units by mouth daily.         Allergies   Allergen Reactions   ??? Pcn [Penicillins] Angioedema       Filed Vitals:    04/10/14 1627 04/10/14 1636   BP: 91/61 90/64   Pulse: 66    Temp: 97.1 ??F (36.2 ??C)    TempSrc: Oral    Resp: 18    Height: 5' 0.51" (1.537 m)    Weight: 127 lb (57.607 kg)    SpO2: 98%          Physical Examination: General appearance - alert, well appearing, and in no distress     Mental status - alert, oriented to person, place, and time  Eyes -  no redness, drainage, or jaundice  Ears - bilateral TM's and external ear canals normal  Nose - normal and patent, no erythema or discharge , sinuses normal and nontender   Mouth - mucous membranes moist, pharynx normal without lesions; no quinsy, stridor, swelling, or drooling   PULM: clear to auscultation, no wheezes, rales or rhonchi, symmetric air entry  CVS - normal rate, regular rhythm, normal S1, S2, no murmurs, rubs, clicks or gallops  GI - soft, nontender, nondistended, no masses or organomegaly   Neurological - alert, oriented, normal speech, no focal findings or movement disorder noted   Musculoskeletal - no joint tenderness, deformity or swelling  Extremities - no edema  Skin - normal coloration       XR Results (maximum last 3):    Results from Appointment encounter on 02/12/14   XR CHEST PA LAT   Narrative Chest, frontal and lateral:    Indication: Cough.    The lungs are clear. The heart and vascularity are normal. The mediastinum and  bony thorax are unremarkable for age.         Impression IMPRESSION:  No active cardiopulmonary disease.              Assessment/ Plan    Jessica Stanley was seen today for fatigue.    Diagnoses and all orders for this visit:    Fatigue, unspecified type: ecg unchanged from one year ago, normal sinus rhythm, normal axis, no acute changes. She will be getting labs from her gi soon and will share these results.  Orders:  -     AMB POC EKG ROUTINE W/ 12 LEADS, INTER & REP  -     TSH, 3RD  GENERATION; Future  -     T4, FREE; Future    Urinary frequency  Orders:  -     AMB POC URINALYSIS DIP STICK AUTO W/O MICROL: 1+ LE, will send for culture.  -  -     CULTURE, URINE; Future         Follow-up Disposition:  Return in about 1 week (around 04/17/2014),for follow up fatigue,   Or sooner  if symptoms worsen or fail to improve,   I have reviewed return/Er/medication precautions and  discussed the  diagnosis with the patient and the intended plan as seen in the above orders.  The patient has been offered and/or received an after-visit summary and questions were answered concerning future plans. Patient verbalized understanding of all instruction.    Darene Lamer, MD

## 2014-04-10 NOTE — Patient Instructions (Signed)
Fatigue: Care Instructions  Your Care Instructions  Fatigue is a feeling of tiredness, exhaustion, or lack of energy. You may feel fatigue because of too much or not enough activity. It can also come from stress, lack of sleep, boredom, and poor diet. Many medical problems, such as viral infections, can cause fatigue. Emotional problems, especially depression, are often the cause of fatigue.  Fatigue is most often a symptom of another problem. Treatment for fatigue depends on the cause. For example, if you have fatigue because you have a certain health problem, treating this problem also treats your fatigue. If depression or anxiety is the cause, treatment may help.  Follow-up care is a key part of your treatment and safety. Be sure to make and go to all appointments, and call your doctor if you are having problems. It's also a good idea to know your test results and keep a list of the medicines you take.  How can you care for yourself at home?  ?? Get regular exercise. But don't overdo it. Go back and forth between rest and exercise.  ?? Get plenty of rest.  ?? Eat a healthy diet. Do not skip meals, especially breakfast.  ?? Reduce your use of caffeine, tobacco, and alcohol. Caffeine is most often found in coffee, tea, cola drinks, and chocolate.  ?? Limit medicines that can cause fatigue. This includes tranquilizers and cold and allergy medicines.  When should you call for help?  Watch closely for changes in your health, and be sure to contact your doctor if:  ?? You have new symptoms such as fever or a rash.  ?? Your fatigue gets worse.  ?? You have been feeling down, depressed, or hopeless. Or you may have lost interest in things that you usually enjoy.  ?? You are not getting better as expected.   Where can you learn more?   Go to http://www.healthwise.net/[facility]  Enter W864 in the search box to learn more about "Fatigue: Care Instructions."   ?? 2006-2015 Healthwise, Inc[employer]. Care instructions adapted under  license by [facility] (which disclaims liability or warranty for this information). This care instruction is for use with your licensed healthcare professional. If you have questions about a medical condition or this instruction, always ask your healthcare professional. Healthwise, Incorporated disclaims any warranty or liability for your use of this information.  Content Version: 10.7.482551; Current as of: January 19, 2013

## 2014-04-10 NOTE — Progress Notes (Signed)
1. Have you been to the ER, urgent care clinic since your last visit?  Hospitalized since your last visit?no    2. Have you seen or consulted any other health care providers outside of the [facility] since your last visit?  Include any pap smears or colon screening. No    Patient Jessica Stanley is a 54 y.o. female presents today for fatigue.

## 2014-04-11 NOTE — Telephone Encounter (Signed)
Patient was told by Dr. Christell ConstantMoore to call back with some info and ask for a nurse. Pt would like to receive a call back. Please advise.

## 2014-04-11 NOTE — Telephone Encounter (Signed)
Returned pt's call. Per pt, her labs include CBC and BMP which she will [facility] at [facility] together with the thyroid order of Dr. Christell ConstantMoore. Pt also request a printed copy of her EKG from yesterday. I informed pt it will be ready for p/u at the front desk today. Closing encounter.

## 2014-04-13 LAB — CULTURE, URINE

## 2014-04-17 ENCOUNTER — Encounter: Attending: Family Medicine | Primary: Family Medicine

## 2014-04-18 ENCOUNTER — Ambulatory Visit
Admit: 2014-04-18 | Discharge: 2014-04-18 | Payer: PRIVATE HEALTH INSURANCE | Attending: Family Medicine | Primary: Family Medicine

## 2014-04-18 DIAGNOSIS — R5383 Other fatigue: Secondary | ICD-10-CM

## 2014-04-18 NOTE — Progress Notes (Signed)
HPI  Jessica Stanley is a 54 y.o. female that presents with  Chief Complaint   Patient presents with   ??? Fatigue   ??? Labs     She reports that fatigue has improved overall, but still feels like she needs to take a mid day nap. She reports stress has increased today since finding out sibling with cancer.  She has colorectal surgery scheduled for next week. She had surgery preop labs, but they would not accept the thyroid labs that I ordered. No urinary symptoms.     Review of Systems:  ROS:  History obtained from the patient  No chest pain, sob, abdominal pain.     Past Medical History   Diagnosis Date   ??? Anxiety        Family History   Problem Relation Age of Onset   ??? Cancer Mother      breast   ??? Stroke Father    ??? Heart Disease Father    ??? Hypertension Father    ??? Heart Disease Maternal Grandmother    ??? Heart Failure Maternal Grandmother    ??? Heart Disease Maternal Grandfather    ??? No Known Problems Paternal Grandmother    ??? Heart Attack Paternal Grandfather        Past Surgical History   Procedure Laterality Date   ??? Hx colonoscopy     ??? Hx appendectomy     ??? Hx partial hysterectomy       Removal of both fallopian tubes and one ovary.   ??? Hx cesarean section     ??? Hx other surgical       Dental implants   ??? Hx orthopaedic  1982     Broken right hand       History     Social History   ??? Marital Status: MARRIED     Spouse Name: N/A   ??? Number of Children: N/A   ??? Years of Education: N/A     Occupational History   ??? Not on file.     Social History Main Topics   ??? Smoking status: Former Smoker -- 1.00 packs/day for 15 years     Types: Cigarettes     Start date: 04/09/1978     Quit date: 04/09/1993   ??? Smokeless tobacco: Never Used   ??? Alcohol Use: Yes      Comment: 2 glasses of wine a day   ??? Drug Use: No   ??? Sexual Activity:     Partners: Male     Pharmacist, hospital Protection: None     Other Topics Concern   ??? Military Service No   ??? Blood Transfusions No   ??? Caffeine Concern No   ??? Occupational Exposure No    ??? Hobby Hazards No   ??? Sleep Concern No   ??? Stress Concern Yes     Job related   ??? Weight Concern No   ??? Special Diet No   ??? Back Care Yes   ??? Exercise Yes   ??? Seat Belt Yes   ??? Self-Exams Yes     Social History Narrative       Outpatient Prescriptions Marked as Taking for the 04/18/14 encounter (Office Visit) with Penelope Galas, MD   Medication Sig Dispense Refill   ??? neomycin (MYCIFRADIN) 500 mg tablet      ??? erythromycin 500 mg tablet      ??? pantoprazole (PROTONIX) 20 mg tablet Take 1 Tab by  mouth daily. Indications: GASTROESOPHAGEAL REFLUX 90 Tab 1   ??? albuterol (PROVENTIL HFA, VENTOLIN HFA, PROAIR HFA) 90 mcg/actuation inhaler Take 2 Puffs by inhalation every four (4) hours as needed for Wheezing. 1 Inhaler 1   ??? multivitamin (ONE A DAY) tablet Take 1 Tab by mouth daily.     ??? ALPRAZolam (XANAX) 0.25 mg tablet Take 0.25 mg by mouth nightly as needed.     ??? ibuprofen (ADVIL) 200 mg tablet Take 600 mg by mouth every eight (8) hours as needed.     ??? cholecalciferol, vitamin D3, (VITAMIN D3) 2,000 unit tab Take 4,000 Units by mouth daily.         Allergies   Allergen Reactions   ??? Pcn [Penicillins] Angioedema       Filed Vitals:    04/18/14 1544   BP: 113/67   Pulse: 81   Temp: 96.9 ??F (36.1 ??C)   TempSrc: Oral   Resp: 18   Height: 5' 0.51" (1.537 m)   Weight: 126 lb (57.153 kg)   SpO2: 99%         Physical Examination: General appearance - alert, well appearing, and in no distress    Mental status - alert, oriented to person, place, and time  Eyes -  no redness, drainage, or jaundice  PULM: clear to auscultation, no wheezes, rales or rhonchi, symmetric air entry  CVS - normal rate, regular rhythm, normal S1, S2, no murmurs, rubs, clicks or gallops  GI - soft, nontender, nondistended, no masses or organomegaly       LABS/IMAGING:  Office Visit on 04/10/2014   Component Date Value Ref Range Status   ??? Color (UA POC) 04/10/2014 Yellow   Final   ??? Clarity (UA POC) 04/10/2014 Cloudy   Final    ??? Glucose (UA POC) 04/10/2014 Negative  Negative Final   ??? Bilirubin (UA POC) 04/10/2014 Negative  Negative Final   ??? Ketones (UA POC) 04/10/2014 Negative  Negative Final   ??? Specific gravity (UA POC) 04/10/2014 1.015  1.001 - 1.035 Final   ??? Blood (UA POC) 04/10/2014 Negative  Negative Final   ??? pH (UA POC) 04/10/2014 8.5* 4.6 - 8.0 Final   ??? Protein (UA POC) 04/10/2014 Negative  Negative mg/dL Final   ??? Urobilinogen (UA POC) 04/10/2014 0.2 mg/dL  0.2 - 1 Final   ??? Nitrites (UA POC) 04/10/2014 Negative  Negative Final   ??? Leukocyte esterase (UA POC) 04/10/2014 1+  Negative Final   ??? Urine Culture, Routine 04/11/2014 *  Final                    Value:Beta hemolytic Streptococcus, group B  25,000-50,000 colony forming units per mL      Comment: Penicillin and ampicillin are drugs of choice for treatment of  beta-hemolytic streptococcal infections. Susceptibility testing of  penicillins and other beta-lactam agents approved by the FDA for  treatment of beta-hemolytic streptococcal infections need not be  performed routinely because nonsusceptible isolates are extremely  rare in any beta-hemolytic streptococcus and have not been reported  for Streptococcus pyogenes (group A). (CLSI 2011)     Office Visit on 02/12/2014   Component Date Value Ref Range Status   ??? VALID INTERNAL CONTROL POC 02/12/2014 Yes   Final   ??? QuickVue Influenza test 02/12/2014 Negative  Negative Final       Assessment/ Plan    Jessica Stanley was seen today for fatigue and labs.    Diagnoses and all  orders for this visit:    Fatigue, unspecified type: improved; she will get thyroid tests today. Stress control discussed.          Follow-up Disposition:  Return if symptoms worsen or fail to improve.  I have  and  discussed the diagnosis with the patient and the intended plan as seen in the above orders.  The patient has been offered and/or received an after-visit summary and questions were answered concerning  future plans. Patient verbalized understanding of all instruction.    Jessica LamerLYNNETTE M Johanthan Kneeland, MD

## 2014-04-18 NOTE — Progress Notes (Signed)
1. Have you been to the ER, urgent care clinic since your last visit?  Hospitalized since your last visit?no    2. Have you seen or consulted any other health care providers outside of the [facility] since your last visit?  Include any pap smears or colon screening. No    Patient Jessica Stanley is a 54 y.o. female  Presents today for fatigue f/u.

## 2014-04-18 NOTE — Patient Instructions (Signed)
Fatigue: Care Instructions  Your Care Instructions  Fatigue is a feeling of tiredness, exhaustion, or lack of energy. You may feel fatigue because of too much or not enough activity. It can also come from stress, lack of sleep, boredom, and poor diet. Many medical problems, such as viral infections, can cause fatigue. Emotional problems, especially depression, are often the cause of fatigue.  Fatigue is most often a symptom of another problem. Treatment for fatigue depends on the cause. For example, if you have fatigue because you have a certain health problem, treating this problem also treats your fatigue. If depression or anxiety is the cause, treatment may help.  Follow-up care is a key part of your treatment and safety. Be sure to make and go to all appointments, and call your doctor if you are having problems. It's also a good idea to know your test results and keep a list of the medicines you take.  How can you care for yourself at home?  ?? Get regular exercise. But don't overdo it. Go back and forth between rest and exercise.  ?? Get plenty of rest.  ?? Eat a healthy diet. Do not skip meals, especially breakfast.  ?? Reduce your use of caffeine, tobacco, and alcohol. Caffeine is most often found in coffee, tea, cola drinks, and chocolate.  ?? Limit medicines that can cause fatigue. This includes tranquilizers and cold and allergy medicines.  When should you call for help?  Watch closely for changes in your health, and be sure to contact your doctor if:  ?? You have new symptoms such as fever or a rash.  ?? Your fatigue gets worse.  ?? You have been feeling down, depressed, or hopeless. Or you may have lost interest in things that you usually enjoy.  ?? You are not getting better as expected.   Where can you learn more?   Go to http://www.healthwise.net/[facility]  Enter W864 in the search box to learn more about "Fatigue: Care Instructions."   ?? 2006-2015 Healthwise, Incorporated. Care instructions adapted under  license by [employer] (which disclaims liability or warranty for this information). This care instruction is for use with your licensed healthcare professional. If you have questions about a medical condition or this instruction, always ask your healthcare professional. Healthwise, Incorporated disclaims any warranty or liability for your use of this information.  Content Version: 10.7.482551; Current as of: January 19, 2013

## 2014-04-22 LAB — T4, FREE: T4, Free: 1.04 ng/dL (ref 0.82–1.77)

## 2014-04-22 LAB — TSH 3RD GENERATION: TSH: 1.62 u[IU]/mL (ref 0.450–4.500)

## 2014-04-23 NOTE — Progress Notes (Signed)
Quick Note:        Advise pt her thyroid labs were normal.    ______

## 2014-04-24 NOTE — Telephone Encounter (Signed)
-----   Message from Penelope GalasLynnette Moore, MD sent at 04/23/2014  9:42 AM EST -----  Advise pt her thyroid labs were normal.

## 2014-04-25 NOTE — Telephone Encounter (Addendum)
Spoke with patient regarding information below. Pt reports that she already had her anal surgery yesterday and just waiting for the result of her biopsy. Otherwise pt is feeling okay. Closing encounter.

## 2014-08-19 MED ORDER — PANTOPRAZOLE 20 MG TAB, DELAYED RELEASE
20 mg | ORAL_TABLET | ORAL | Status: DC
Start: 2014-08-19 — End: 2014-11-16

## 2014-10-16 ENCOUNTER — Encounter: Attending: Family Medicine | Primary: Family Medicine

## 2014-10-17 ENCOUNTER — Encounter: Attending: Family Medicine | Primary: Family Medicine

## 2014-11-15 ENCOUNTER — Encounter: Admit: 2014-11-15 | Primary: Family Medicine

## 2014-11-15 ENCOUNTER — Ambulatory Visit: Admit: 2014-11-15 | Payer: PRIVATE HEALTH INSURANCE | Attending: Family Medicine | Primary: Family Medicine

## 2014-11-15 DIAGNOSIS — G8929 Other chronic pain: Secondary | ICD-10-CM

## 2014-11-15 DIAGNOSIS — M25571 Pain in right ankle and joints of right foot: Secondary | ICD-10-CM

## 2014-11-15 LAB — AMB POC URINALYSIS DIP STICK AUTO W/O MICRO
Bilirubin (UA POC): NEGATIVE
Glucose (UA POC): NEGATIVE
Ketones (UA POC): NEGATIVE
Leukocyte esterase (UA POC): NEGATIVE
Nitrites (UA POC): NEGATIVE
Protein (UA POC): NEGATIVE mg/dL
Specific gravity (UA POC): 1.015 (ref 1.001–1.035)
Urobilinogen (UA POC): 0.2 (ref 0.2–1)
pH (UA POC): 7.5 (ref 4.6–8.0)

## 2014-11-15 MED ORDER — METHOCARBAMOL 500 MG TAB
500 mg | ORAL_TABLET | Freq: Two times a day (BID) | ORAL | 0 refills | Status: DC | PRN
Start: 2014-11-15 — End: 2015-05-01

## 2014-11-15 MED ORDER — IBUPROFEN 600 MG TAB
600 mg | ORAL_TABLET | Freq: Three times a day (TID) | ORAL | 0 refills | Status: AC | PRN
Start: 2014-11-15 — End: ?

## 2014-11-15 NOTE — Progress Notes (Signed)
Advise pt her b/l ankles xrays were wnl.

## 2014-11-15 NOTE — Patient Instructions (Signed)
Learning About How to Have a Healthy Back  What causes back pain?     Back pain is often caused by overuse, strain, or injury. For example, people often hurt their backs playing sports or working in the yard, being jolted in a car accident, or lifting something too heavy.  Aging plays a part too. Your bones and muscles tend to lose strength as you age, which makes injury more likely. The spongy discs between the bones of the spine (vertebrae) may suffer from wear and tear and no longer provide enough cushion between the bones. A disc that bulges or breaks open (herniated disc) can press on nerves, causing back pain.  In some people, back pain is the result of arthritis, broken vertebrae caused by bone loss (osteoporosis), illness, or a spine problem.  Although most people have back pain at one time or another, there are steps you can take to make it less likely.  How can you have a healthy back?  Reduce stress on your back through good posture   Slumping or slouching alone may not cause low back pain. But after the back has been strained or injured, bad posture can make pain worse.  ?? Sleep in a position that maintains your back's normal curves and on a mattress that feels comfortable. Sleep on your side with a pillow between your knees, or sleep on your back with a pillow under your knees. These positions can reduce strain on your back.  ?? Stand and sit up straight. "Good posture" generally means your ears, shoulders, and hips are in a straight line.  ?? If you must stand for a long time, put one foot on a stool, ledge, or box. Switch feet every now and then.  ?? Sit in a chair that is low enough to let you place both feet flat on the floor with both knees nearly level with your hips. If your chair or desk is too high, use a footrest to raise your knees. Place a small pillow, a rolled-up towel, or a lumbar roll in the curve of your back if you need extra support.   ?? Try a kneeling chair, which helps tilt your hips forward. This takes pressure off your lower back.  ?? Try sitting on an exercise ball. It can rock from side to side, which helps keep your back loose.  ?? When driving, keep your knees nearly level with your hips. Sit straight, and drive with both hands on the steering wheel. Your arms should be in a slightly bent position.  Reduce stress on your back through careful lifting   ?? Squat down, bending at the hips and knees only. If you need to, put one knee to the floor and extend your other knee in front of you, bent at a right angle (half kneeling).  ?? Press your chest straight forward. This helps keep your upper back straight while keeping a slight arch in your low back.  ?? Hold the load as close to your body as possible, at the level of your belly button (navel).  ?? Use your feet to change direction, taking small steps.  ?? Lead with your hips as you change direction. Keep your shoulders in line with your hips as you move.  ?? Set down your load carefully, squatting with your knees and hips only.  Exercise and stretch your back   ?? Do some exercise on most days of the week, if your doctor says it is okay. You can   walk, run, swim, or cycle.  ?? Stretch your back muscles. Here are a few exercises to try:  ?? Lie on your back, and gently pull one bent knee to your chest. Put that foot back on the floor, and then pull the other knee to your chest.  ?? Do pelvic tilts. Lie on your back with your knees bent. Tighten your stomach muscles. Pull your belly button (navel) in and up toward your ribs. You should feel like your back is pressing to the floor and your hips and pelvis are slightly lifting off the floor. Hold for 6 seconds while breathing smoothly.  ?? Sit with your back flat against a wall.  ?? Keep your core muscles strong. The muscles of your back, belly (abdomen), and buttocks support your spine.  ?? Pull in your belly and imagine pulling your navel toward your spine.  Hold this for 6 seconds, then relax. Remember to keep breathing normally as you tense your muscles.  ?? Do curl-ups. Always do them with your knees bent. Keep your low back on the floor, and curl your shoulders toward your knees using a smooth, slow motion. Keep your arms folded across your chest. If this bothers your neck, try putting your hands behind your neck (not your head), with your elbows spread apart.  ?? Lie on your back with your knees bent and your feet flat on the floor. Tighten your belly muscles, and then push with your feet and raise your buttocks up a few inches. Hold this position 6 seconds as you continue to breathe normally, then lower yourself slowly to the floor. Repeat 8 to 12 times.  ?? If you like group exercise, try Pilates or yoga. These classes have poses that strengthen the core muscles.  Lead a healthy lifestyle   ?? Stay at a healthy weight to avoid strain on your back.  ?? Do not smoke. Smoking increases the risk of osteoporosis, which weakens the spine. If you need help quitting, talk to your doctor about stop-smoking programs and medicines. These can increase your chances of quitting for good.  Where can you learn more?  Go to [employer]  Enter L315 in the search box to learn more about "Learning About How to Have a Healthy Back."  ?? 2006-2016 Healthwise, Incorporated. Care instructions adapted under license by [employer] (which disclaims liability or warranty for this information). This care instruction is for use with your licensed healthcare professional. If you have questions about a medical condition or this instruction, always ask your healthcare professional. Healthwise, Incorporated disclaims any warranty or liability for your use of this information.  Content Version: 10.9.538570; Current as of: Jul 27, 2013        Gastroesophageal Reflux Disease (GERD): Care Instructions  Your Care Instructions      Gastroesophageal reflux disease (GERD) is the backward flow of stomach acid into the esophagus. The esophagus is the tube that leads from your throat to your stomach. A one-way valve prevents the stomach acid from moving up into this tube. When you have GERD, this valve does not close tightly enough.  If you have mild GERD symptoms including heartburn, you may be able to control the problem with antacids or over-the-counter medicine. Changing your diet, losing weight, and making other lifestyle changes can also help reduce symptoms.  Follow-up care is a key part of your treatment and safety. Be sure to make and go to all appointments, and call your doctor if you are having problems.  It???s also a good idea to know your test results and keep a list of the medicines you take.  How can you care for yourself at home?  ?? Take your medicines exactly as prescribed. Call your doctor if you think you are having a problem with your medicine.  ?? Your doctor may recommend over-the-counter medicine. For mild or occasional indigestion, antacids, such as Tums, Gaviscon, Mylanta, or Maalox, may help. Your doctor also may recommend over-the-counter acid reducers, such as Pepcid AC, Tagamet HB, Zantac 75, or Prilosec. Read and follow all instructions on the label. If you use these medicines often, talk with your doctor.  ?? Change your eating habits.  ?? It???s best to eat several small meals instead of two or three large meals.  ?? After you eat, wait 2 to 3 hours before you lie down.  ?? Chocolate, mint, and alcohol can make GERD worse.  ?? Spicy foods, foods that have a lot of acid (like tomatoes and oranges), and coffee can make GERD symptoms worse in some people. If your symptoms are worse after you eat a certain food, you may want to stop eating that food to see if your symptoms get better.  ?? Do not smoke or chew tobacco. Smoking can make GERD worse. If you need help quitting, talk to your doctor about stop-smoking programs and  medicines. These can increase your chances of quitting for good.  ?? If you have GERD symptoms at night, raise the head of your bed 6 to 8 inches by putting the frame on blocks or placing a foam wedge under the head of your mattress. (Adding extra pillows does not work.)  ?? Do not wear tight clothing around your middle.  ?? Lose weight if you need to. Losing just 5 to 10 pounds can help.  When should you call for help?  Call your doctor now or seek immediate medical care if:  ?? You have new or different belly pain.  ?? Your stools are black and tarlike or have streaks of blood.  Watch closely for changes in your health, and be sure to contact your doctor if:  ?? Your symptoms have not improved after 2 days.  ?? Food seems to catch in your throat or chest.  Where can you learn more?  Go to [employer]  Enter [phone] in the search box to learn more about "Gastroesophageal Reflux Disease (GERD): Care Instructions."  ?? 2006-2016 Healthwise, Inc[employer]. Care instructions adapted under license by [employer] (which disclaims liability or warranty for this information). This care instruction is for use with your licensed healthcare professional. If you have questions about a medical condition or this instruction, always ask your healthcare professional. Healthwise, Incorporated disclaims any warranty or liability for your use of this information.  Content Version: 10.9.538570; Current as of: January 25, 2014

## 2014-11-15 NOTE — Progress Notes (Addendum)
1. Have you been to the ER, urgent care clinic since your last visit?  Hospitalized since your last visit?no    2. Have you seen or consulted any other health care providers outside of the [facility] since your last visit?  Include any pap smears or colon screening. No    Patient Jessica Stanley is a 54 y.o. female presents today for ankle and back pain.

## 2014-11-15 NOTE — Progress Notes (Signed)
HPI  Jessica Stanley is a 54 y.o. female that presents with  Chief Complaint   Patient presents with   ??? Ankle Pain   ??? Back Pain     Low back discomfort off/on with some spasm some times.. No back pain presently. Advil helps.     B/l ankle achiness, some achilles tenderness also sometimes. H/o b.l ankle fractures as youth, when doing gymnastics.    She inquires how long she can take protonix, which controls gerd, due to recent reports of side effects. Zantac had stopped working before, had taking it about 4 years. She reports she has had upper endoscopy last year and she does not have Barretts esophagus.     Urinary urgency/frequency sometimes, no dysuria, no fevers n/v or abdominal pain. No hematuria.      Review of Systems:  ROS:  History obtained from the patient  See hpi.     Past Medical History   Diagnosis Date   ??? Anxiety    ??? Arthritis      djd changes noted on 2015 lumbar xray   ??? GERD (gastroesophageal reflux disease)      she reports she had upper endoscopy 2015, with no Barretts found   ??? History of ankle fracture      right ankle twice, left ankle once; previously did gymnastics.       Family History   Problem Relation Age of Onset   ??? Cancer Mother      breast   ??? Stroke Father    ??? Heart Disease Father    ??? Hypertension Father    ??? Heart Disease Maternal Grandmother    ??? Heart Failure Maternal Grandmother    ??? Heart Disease Maternal Grandfather    ??? No Known Problems Paternal Grandmother    ??? Heart Attack Paternal Grandfather        Past Surgical History   Procedure Laterality Date   ??? Hx colonoscopy     ??? Hx appendectomy     ??? Hx partial hysterectomy       Removal of both fallopian tubes and one ovary.   ??? Hx cesarean section     ??? Hx other surgical       Dental implants   ??? Hx orthopaedic  1982     Broken right hand       Social History     Social History   ??? Marital status: MARRIED     Spouse name: N/A   ??? Number of children: N/A   ??? Years of education: N/A     Occupational History    ??? Not on file.     Social History Main Topics   ??? Smoking status: Former Smoker     Packs/day: 1.00     Years: 15.00     Types: Cigarettes     Start date: 04/09/1978     Quit date: 04/09/1993   ??? Smokeless tobacco: Never Used   ??? Alcohol use Yes      Comment: 2 glasses of wine a day   ??? Drug use: No   ??? Sexual activity: Yes     Partners: Male     Birth control/ protection: None     Other Topics Concern   ??? Military Service No   ??? Blood Transfusions No   ??? Caffeine Concern No   ??? Occupational Exposure No   ??? Hobby Hazards No   ??? Sleep Concern No   ??? Stress  Concern Yes     Job related   ??? Weight Concern No   ??? Special Diet No   ??? Back Care Yes   ??? Exercise Yes   ??? Seat Belt Yes   ??? Self-Exams Yes     Social History Narrative       Outpatient Prescriptions Marked as Taking for the 11/15/14 encounter (Office Visit) with Penelope Galas, MD   Medication Sig Dispense Refill   ??? ibuprofen (MOTRIN) 600 mg tablet Take 1 Tab by mouth every eight (8) hours as needed for Pain (take with food). 30 Tab 0   ??? pantoprazole (PROTONIX) 20 mg tablet TAKE 1 TABLET DAILY FOR GASTROESOPHAGEAL REFLUX 90 Tab 0   ??? albuterol (PROVENTIL HFA, VENTOLIN HFA, PROAIR HFA) 90 mcg/actuation inhaler Take 2 Puffs by inhalation every four (4) hours as needed for Wheezing. 1 Inhaler 1   ??? multivitamin (ONE A DAY) tablet Take 1 Tab by mouth daily.     ??? ALPRAZolam (XANAX) 0.25 mg tablet Take 0.25 mg by mouth nightly as needed.     ??? ibuprofen (ADVIL) 200 mg tablet Take 600 mg by mouth every eight (8) hours as needed.     ??? cholecalciferol, vitamin D3, (VITAMIN D3) 2,000 unit tab Take 4,000 Units by mouth daily.         Allergies   Allergen Reactions   ??? Pcn [Penicillins] Angioedema       Vitals:    11/15/14 0948   BP: 104/69   Pulse: 66   Resp: 18   Temp: 96.2 ??F (35.7 ??C)   TempSrc: Oral   SpO2: 97%   Weight: 128 lb (58.1 kg)   Height: 5' 0.51" (1.537 m)       No LMP recorded. Patient is not currently having periods (Reason: Menopause).       Physical Examination: General appearance - alert, well appearing, and in no distress    Mental status - alert, oriented to person, place, and time  Eyes -  no redness, drainage, or jaundice  Nose - normal and patent  Mouth - mucous membranes moist, pharynx normal without lesions; no quinsy, stridor, swelling, or drooling   PULM: clear to auscultation, no wheezes, rales or rhonchi, symmetric air entry  CVS - normal rate, regular rhythm, normal S1, S2, no murmurs, rubs, clicks or gallops  GI - soft, nontender, nondistended, no masses or organomegaly   Back exam - full range of motion, no tenderness, palpable spasm or pain on motion  Neurological - alert, oriented, normal speech, no focal findings or movement disorder noted   Musculoskeletal - b/l ankles with no redness, swelling or tenderness to palpation.  wearing high platform sandals.   Extremities - no ankle or pedal edema noted, good pulses, normal color, temperature; no ankle swelling or tenderenss.    Result Information   Status Provider Status      Final result (Exam End: 01/28/2014 ??2:40 PM) Reviewed    Result Notes   Notes Recorded by Penelope Galas, MD on 01/28/2014 at 5:46 PM  Please send letter that her back xray shows mild degenerative changes (arthritis)      ??   Study Result   History: No back pain   ??  Three separate views. Mild degenerative changes are seen with small marginal  osteophytes. No fracture or dislocation noted. Mild facet hypertrophy seen at  the L5-S1 level. Numerous lower pelvic calcifications are likely phleboliths.  ??  Impression:  ??  Mild degenerative  changes.          Assessment/ Plan    Florestine was seen today for ankle pain and back pain.    Diagnoses and all orders for this visit:    Chronic low back pain, unspecified back pain laterality, with sciatica presence unspecified  -     ibuprofen (MOTRIN) 600 mg tablet; Take 1 Tab by mouth every eight (8) hours as needed for Pain (take with food).   --     methocarbamol (ROBAXIN) 500 mg tablet; Take 1 Tab by mouth two (2) times daily as needed. Drowsiness precautions.  Indications: MUSCLE SPASM    Urinary frequency: trace blood on poc sample, will send for full processing.  -     AMB POC URINALYSIS DIP STICK AUTO W/O MICRO  -     URINALYSIS W/ RFLX MICROSCOPIC; Future    Bilateral ankle pain, unspecified chronicity. Official report pending  -     XR ANKLE LT AP/LAT; Future  -     XR ANKLE RT AP/LAT; Future  -     ibuprofen (MOTRIN) 600 mg tablet; Take 1 Tab by mouth every eight (8) hours as needed for Pain (take with food).    Gastroesophageal reflux disease without esophagitis: currently controlled. She wants to try taking ppi every  other day to see if still controlled with decreased frequency of med and will elevate her head of bed. She is advised to space liquids 30 minutes prior to meals, 1-2 hours after meals. Discussed risk/benefits of PPI's         Follow-up Disposition:  Return if symptoms worsen or fail to improve.  I have reviewed return/Er/medication precautions and  discussed the diagnosis with the patient (or guardian/parent if applicable)  and the intended plan as seen in the above orders.  The patient has been offered and/or received an after-visit summary and questions were answered concerning future plans. Patient verbalized understanding of all instruction.    Medication Side Effects and Warnings were discussed with patient: yes    Darene Lamer, MD

## 2014-11-16 LAB — URINALYSIS W/ RFLX MICROSCOPIC
Bilirubin: NEGATIVE
Blood: NEGATIVE
Glucose: NEGATIVE
Ketone: NEGATIVE
Leukocyte Esterase: NEGATIVE
Nitrites: NEGATIVE
Protein: NEGATIVE
Specific Gravity: 1.005 (ref 1.005–1.030)
Urobilinogen: 0.2 mg/dL (ref 0.2–1.0)
pH (UA): 7.5 (ref 5.0–7.5)

## 2014-11-20 NOTE — Telephone Encounter (Signed)
-----   Message from Penelope Galas, MD sent at 11/15/2014  3:47 PM EDT -----  Advise pt her b/l ankles xrays were wnl.

## 2014-11-20 NOTE — Telephone Encounter (Signed)
Patient has been mailed letter in regard to results. Closing encounter.

## 2014-11-27 MED ORDER — PANTOPRAZOLE 20 MG TAB, DELAYED RELEASE
20 mg | ORAL_TABLET | ORAL | 0 refills | Status: DC
Start: 2014-11-27 — End: 2015-02-22

## 2015-02-24 NOTE — Telephone Encounter (Signed)
Dr. Cheron SchaumannSarino, please see refill request for patient of Dr.moore. Patient has not been seen since September and no follow up apt scheduled. thank you!    Requested Prescriptions     Pending Prescriptions Disp Refills   ??? pantoprazole (PROTONIX) 20 mg tablet [Pharmacy Med Name: PANTOPRAZOLE SOD DR TABS 20MG ] 90 Tab      Sig: TAKE 1 TABLET DAILY FOR GASTROESOPHAGEAL REFLUX

## 2015-02-25 MED ORDER — PANTOPRAZOLE 20 MG TAB, DELAYED RELEASE
20 mg | ORAL_TABLET | ORAL | 0 refills | Status: DC
Start: 2015-02-25 — End: 2015-03-05

## 2015-03-05 ENCOUNTER — Ambulatory Visit
Admit: 2015-03-05 | Discharge: 2015-03-05 | Payer: PRIVATE HEALTH INSURANCE | Attending: Family Medicine | Primary: Family Medicine

## 2015-03-05 DIAGNOSIS — J40 Bronchitis, not specified as acute or chronic: Secondary | ICD-10-CM

## 2015-03-05 MED ORDER — PANTOPRAZOLE 20 MG TAB, DELAYED RELEASE
20 mg | ORAL_TABLET | ORAL | 1 refills | Status: DC
Start: 2015-03-05 — End: 2015-10-30

## 2015-03-05 MED ORDER — AZITHROMYCIN 250 MG TAB
250 mg | ORAL_TABLET | ORAL | 0 refills | Status: DC
Start: 2015-03-05 — End: 2015-03-05

## 2015-03-05 MED ORDER — AZITHROMYCIN 250 MG TAB
250 mg | ORAL_TABLET | ORAL | 0 refills | Status: AC
Start: 2015-03-05 — End: 2015-03-10

## 2015-03-05 MED ORDER — HYDROCODONE 10 MG-CHLORPHENIRAMINE 8 MG/5 ML ORAL SUSP EXTEND.REL 12HR
10-8 mg/5 mL | Freq: Two times a day (BID) | ORAL | 0 refills | Status: DC | PRN
Start: 2015-03-05 — End: 2015-05-20

## 2015-03-05 NOTE — Patient Instructions (Addendum)
Bronchitis: Care Instructions  Your Care Instructions    Bronchitis is inflammation of the bronchial tubes, which carry air to the lungs. The tubes swell and produce mucus, or phlegm. The mucus and inflamed bronchial tubes make you cough. You may have trouble breathing.  Most cases of bronchitis are caused by viruses like those that cause colds. Antibiotics usually do not help and they may be harmful.  Bronchitis usually develops rapidly and lasts about 2 to 3 weeks in otherwise healthy people.  Follow-up care is a key part of your treatment and safety. Be sure to make and go to all appointments, and call your doctor if you are having problems. It's also a good idea to know your test results and keep a list of the medicines you take.  How can you care for yourself at home?  ?? Take all medicines exactly as prescribed. Call your doctor if you think you are having a problem with your medicine.  ?? Get some extra rest.  ?? Take an over-the-counter pain medicine, such as acetaminophen (Tylenol), ibuprofen (Advil, Motrin), or naproxen (Aleve) to reduce fever and relieve body aches. Read and follow all instructions on the label.  ?? Do not take two or more pain medicines at the same time unless the doctor told you to. Many pain medicines have acetaminophen, which is Tylenol. Too much acetaminophen (Tylenol) can be harmful.  ?? Take an over-the-counter cough medicine that contains dextromethorphan to help quiet a dry, hacking cough so that you can sleep. Avoid cough medicines that have more than one active ingredient. Read and follow all instructions on the label.  ?? Breathe moist air from a humidifier, hot shower, or sink filled with hot water. The heat and moisture will thin mucus so you can cough it out.  ?? Do not smoke. Smoking can make bronchitis worse. If you need help quitting, talk to your doctor about stop-smoking programs and medicines. These can increase your chances of quitting for good.   When should you call for help?  Call 911 anytime you think you may need emergency care. For example, call if:  ?? You have severe trouble breathing.  Call your doctor now or seek immediate medical care if:  ?? You have new or worse trouble breathing.  ?? You cough up dark brown or bloody mucus (sputum).  ?? You have a new or higher fever.  ?? You have a new rash.  Watch closely for changes in your health, and be sure to contact your doctor if:  ?? You cough more deeply or more often, especially if you notice more mucus or a change in the color of your mucus.  ?? You are not getting better as expected.  Where can you learn more?  Go to http://www.healthwise.net/[facility].  Enter H333 in the search box to learn more about "Bronchitis: Care Instructions."  Current as of: Jul 29, 2014  Content Version: 11.1  ?? 2006-2016 [employer]. Care instructions adapted under license by [employer] (which disclaims liability or warranty for this information). If you have questions about a medical condition or this instruction, always ask your healthcare professional. Healthwise, Inc[employer] disclaims any warranty or liability for your use of this information.

## 2015-03-05 NOTE — Progress Notes (Signed)
Jessica Stanley is a 54 y.o. Caucasian female and presents with a few days hx of productive cough, URI symptoms similar to   Dec 2015. She c/o many coworkers being sick and she is driving to [employer] tomorrow. She was successfully treated with z pak last Dec.     Chief Complaint   Patient presents with   ??? Cough   ??? Ear Pain     Subjective:    Additional Concerns: none    Patient Active Problem List    Diagnosis Date Noted   ??? Gastroesophageal reflux disease without esophagitis 11/15/2014     Current Outpatient Prescriptions   Medication Sig Dispense Refill   ??? pantoprazole (PROTONIX) 20 mg tablet TAKE 1 TABLET DAILY FOR GASTROESOPHAGEAL REFLUX 90 Tab 1   ??? azithromycin (ZITHROMAX) 250 mg tablet Take 2 tablets today, then take 1 tablet daily 6 Tab 0   ??? chlorpheniramine-HYDROcodone (TUSSIONEX PENNKINETIC ER) 10-8 mg/5 mL suspension Take 5 mL by mouth every twelve (12) hours as needed for Cough. Max Daily Amount: 10 mL. 60 mL 0   ??? ibuprofen (MOTRIN) 600 mg tablet Take 1 Tab by mouth every eight (8) hours as needed for Pain (take with food). 30 Tab 0   ??? albuterol (PROVENTIL HFA, VENTOLIN HFA, PROAIR HFA) 90 mcg/actuation inhaler Take 2 Puffs by inhalation every four (4) hours as needed for Wheezing. 1 Inhaler 1   ??? multivitamin (ONE A DAY) tablet Take 1 Tab by mouth daily.     ??? ALPRAZolam (XANAX) 0.25 mg tablet Take 0.25 mg by mouth nightly as needed.     ??? cholecalciferol, vitamin D3, (VITAMIN D3) 2,000 unit tab Take 4,000 Units by mouth daily.     ??? methocarbamol (ROBAXIN) 500 mg tablet Take 1 Tab by mouth two (2) times daily as needed. Drowsiness precautions.  Indications: MUSCLE SPASM 30 Tab 0   ??? ibuprofen (ADVIL) 200 mg tablet Take 600 mg by mouth every eight (8) hours as needed.       Allergies   Allergen Reactions   ??? Pcn [Penicillins] Angioedema     Past Medical History   Diagnosis Date   ??? Anxiety    ??? Arthritis      djd changes noted on 2015 lumbar xray   ??? GERD (gastroesophageal reflux disease)       she reports she had upper endoscopy 2015, with no Barretts found   ??? History of ankle fracture      right ankle twice, left ankle once; previously did gymnastics.     Past Surgical History   Procedure Laterality Date   ??? Hx colonoscopy     ??? Hx appendectomy     ??? Hx partial hysterectomy       Removal of both fallopian tubes and one ovary.   ??? Hx cesarean section     ??? Hx other surgical       Dental implants   ??? Hx orthopaedic  1982     Broken right hand     Family History   Problem Relation Age of Onset   ??? Cancer Mother      breast   ??? Stroke Father    ??? Heart Disease Father    ??? Hypertension Father    ??? Heart Disease Maternal Grandmother    ??? Heart Failure Maternal Grandmother    ??? Heart Disease Maternal Grandfather    ??? No Known Problems Paternal Grandmother    ??? Heart Attack Paternal Grandfather  Social History   Substance Use Topics   ??? Smoking status: Former Smoker     Packs/day: 1.00     Years: 15.00     Types: Cigarettes     Start date: 04/09/1978     Quit date: 04/09/1993   ??? Smokeless tobacco: Never Used   ??? Alcohol use Yes      Comment: 2 glasses of wine a day     ROS     General: negative for - chills, fatigue, fever, weight change  Endo: negative for - hot flashes, polydipsia/polyuria or temperature intolerance  Resp: positive for - productive cough, shortness of breath or wheezing  CV: negative for - chest pain, edema or palpitations  GI: negative for - abdominal pain, change in bowel habits, constipation, diarrhea or nausea/vomiting  GU: negative for - dysuria, hematuria, incontinence, pelvic pain or vulvar/vaginal symptoms  MSK: negative for - joint pain, joint swelling or muscle pain    Objective:  Vitals:    03/05/15 1334   BP: 117/68   Pulse: 69   Resp: 16   Temp: 97.3 ??F (36.3 ??C)   TempSrc: Oral   SpO2: 100%   Weight: 128 lb 3.2 oz (58.2 kg)   Height: 5' (1.524 m)   PainSc:   8   PainLoc: Head       alert, well appearing, and in no distress, oriented to person, place, and time and overweight   Mental status - alert, oriented to person, place, and time, normal mood, behavior, speech, dress, motor activity, and thought processes  Chest - clear to auscultation, no wheezes, rales or rhonchi, symmetric air entry  Heart - normal rate, regular rhythm, normal S1, S2, no murmurs, rubs, clicks or gallops  Extremities - peripheral pulses normal, no pedal edema, no clubbing or cyanosis    LABS   Office Visit on 11/15/2014   Component Date Value Ref Range Status   ??? Color (UA POC) 11/15/2014 Yellow   Final   ??? Clarity (UA POC) 11/15/2014 Clear   Final   ??? Glucose (UA POC) 11/15/2014 Negative  Negative Final   ??? Bilirubin (UA POC) 11/15/2014 Negative  Negative Final   ??? Ketones (UA POC) 11/15/2014 Negative  Negative Final   ??? Specific gravity (UA POC) 11/15/2014 1.015  1.001 - 1.035 Final   ??? Blood (UA POC) 11/15/2014 Trace  Negative Final   ??? pH (UA POC) 11/15/2014 7.5  4.6 - 8.0 Final   ??? Protein (UA POC) 11/15/2014 Negative  Negative mg/dL Final   ??? Urobilinogen (UA POC) 11/15/2014 0.2 mg/dL  0.2 - 1 Final   ??? Nitrites (UA POC) 11/15/2014 Negative  Negative Final   ??? Leukocyte esterase (UA POC) 11/15/2014 Negative  Negative Final   ??? Specific Gravity 11/15/2014 1.005  1.005 - 1.030 Final   ??? pH (UA) 11/15/2014 7.5  5.0 - 7.5 Final   ??? Color 11/15/2014 Yellow  Yellow Final   ??? Appearance 11/15/2014 Cloudy* Clear Final   ??? Leukocyte Esterase 11/15/2014 Negative  Negative Final   ??? Protein 11/15/2014 Negative  Negative/Trace Final   ??? Glucose 11/15/2014 Negative  Negative Final   ??? Ketone 11/15/2014 Negative  Negative Final   ??? Blood 11/15/2014 Negative  Negative Final   ??? Bilirubin 11/15/2014 Negative  Negative Final   ??? Urobilinogen 11/15/2014 0.2  0.2 - 1.0 mg/dL Final   ??? Nitrites 69/62/9528 Negative  Negative Final   ??? Microscopic Examination 11/15/2014 Comment   Final  Microscopic not indicated and not performed.       TESTS  Results for orders placed or performed in visit on 11/15/14    URINALYSIS W/ RFLX MICROSCOPIC   Result Value Ref Range    Specific Gravity 1.005 1.005 - 1.030    pH (UA) 7.5 5.0 - 7.5    Color Yellow Yellow    Appearance Cloudy (A) Clear    Leukocyte Esterase Negative Negative    Protein Negative Negative/Trace    Glucose Negative Negative    Ketone Negative Negative    Blood Negative Negative    Bilirubin Negative Negative    Urobilinogen 0.2 0.2 - 1.0 mg/dL    Nitrites Negative Negative    Microscopic Examination Comment    AMB POC URINALYSIS DIP STICK AUTO W/O MICRO   Result Value Ref Range    Color (UA POC) Yellow     Clarity (UA POC) Clear     Glucose (UA POC) Negative Negative    Bilirubin (UA POC) Negative Negative    Ketones (UA POC) Negative Negative    Specific gravity (UA POC) 1.015 1.001 - 1.035    Blood (UA POC) Trace Negative    pH (UA POC) 7.5 4.6 - 8.0    Protein (UA POC) Negative Negative mg/dL    Urobilinogen (UA POC) 0.2 mg/dL 0.2 - 1    Nitrites (UA POC) Negative Negative    Leukocyte esterase (UA POC) Negative Negative     Assessment/Plan:      1. Acute Bronchitis - Tussionex PRN for severe cough  - azithromycin (ZITHROMAX) 250 mg tablet; Take 2 tablets today, then take 1 tablet daily  Dispense: 6 Tab; Refill: 0    2. GERD stable - Refilled Protonix po daily x 6 months.     Lab review: no lab studies available for review at time of visit    I have discussed the diagnosis with the patient and the intended plan as seen in the above orders.  The patient has received an after-visit summary and questions were answered concerning future plans.  I have discussed medication side effects and warnings with the patient as well.I have reviewed the plan of care with the patient, accepted their input and they are in agreement with the treatment goals.     F/U as needed    Sheela Stack, MD

## 2015-03-05 NOTE — Progress Notes (Signed)
Romie Minuslicia Cox Stroble is a 54 y.o. female presents to office for cough and ear pain for a few days.       1. Have you been to the ER, urgent care clinic or hospitalized since your last visit? no  2. [facility]? no  3. Have you had a Flu shot this year? Pt declines       Health Maintenance items with a due date reviewed with patient:  Health Maintenance Due   Topic Date Due   ??? Hepatitis C Screening  June 29, 1960   ??? DTaP/Tdap/Td series (1 - Tdap) 02/22/1980   ??? INFLUENZA AGE 55 TO ADULT  10/07/2014   ??? PAP AKA CERVICAL CYTOLOGY  11/30/2014   ??? FOBT Q 1 YEAR AGE 63-75  03/29/2015

## 2015-05-01 ENCOUNTER — Encounter: Attending: Internal Medicine | Primary: Family Medicine

## 2015-05-01 ENCOUNTER — Ambulatory Visit
Admit: 2015-05-01 | Discharge: 2015-05-01 | Payer: PRIVATE HEALTH INSURANCE | Attending: Internal Medicine | Primary: Family Medicine

## 2015-05-01 DIAGNOSIS — R002 Palpitations: Secondary | ICD-10-CM

## 2015-05-01 NOTE — Progress Notes (Signed)
HISTORY OF PRESENT ILLNESS  Jessica Stanley is a 55 y.o. female for evaluation of ankle pain and palpitations.  She states that she gets palpitations periodically which is accompanied by shortness of breath without dizziness.  She denies chest discomfort.  This has happened on 3 occasions.  This sometimes awakens her from sleep.  Patient also complains of noticing pain lateral malleolus bilaterally while walking down steps.. Patient states that she has broken both ankles while in high school.  Ankle Pain    The history is provided by the patient. This is a chronic problem. The problem has been gradually worsening. The pain is present in the left ankle. The pain is at a severity of 6/10. The pain is moderate. Pertinent negatives include no numbness. She has tried nothing for the symptoms. There has been a history of trauma.   Palpitations    The history is provided by the patient. This is a new problem. The problem has been gradually worsening. The problem occurs every several days. The problem is associated with nothing. Associated symptoms include shortness of breath. Pertinent negatives include no diaphoresis, no fever, no malaise/fatigue, no numbness, no chest pain, no chest pressure, no claudication, no exertional chest pressure, no irregular heartbeat, no near-syncope, no orthopnea, no PND, no dizziness and no weakness. Risk factors include family history. She has tried nothing for the symptoms. Her past medical history does not include hyperthyroidism, anemia, heart disease, hypertension, atrial fibrillation or SVT.     Allergies   Allergen Reactions   ??? Pcn [Penicillins] Angioedema     Current Outpatient Prescriptions on File Prior to Visit   Medication Sig Dispense Refill   ??? pantoprazole (PROTONIX) 20 mg tablet TAKE 1 TABLET DAILY FOR GASTROESOPHAGEAL REFLUX 90 Tab 1   ??? chlorpheniramine-HYDROcodone (TUSSIONEX PENNKINETIC ER) 10-8 mg/5 mL  suspension Take 5 mL by mouth every twelve (12) hours as needed for Cough. Max Daily Amount: 10 mL. 60 mL 0   ??? ibuprofen (MOTRIN) 600 mg tablet Take 1 Tab by mouth every eight (8) hours as needed for Pain (take with food). 30 Tab 0   ??? albuterol (PROVENTIL HFA, VENTOLIN HFA, PROAIR HFA) 90 mcg/actuation inhaler Take 2 Puffs by inhalation every four (4) hours as needed for Wheezing. 1 Inhaler 1   ??? multivitamin (ONE A DAY) tablet Take 1 Tab by mouth daily.     ??? ALPRAZolam (XANAX) 0.25 mg tablet Take 0.25 mg by mouth nightly as needed.     ??? ibuprofen (ADVIL) 200 mg tablet Take 600 mg by mouth every eight (8) hours as needed.     ??? cholecalciferol, vitamin D3, (VITAMIN D3) 2,000 unit tab Take 4,000 Units by mouth daily.       No current facility-administered medications on file prior to visit.      Past Medical History:   Diagnosis Date   ??? Anxiety    ??? Arthritis     djd changes noted on 2015 lumbar xray   ??? GERD (gastroesophageal reflux disease)     she reports she had upper endoscopy 2015, with no Barretts found   ??? History of ankle fracture     right ankle twice, left ankle once; previously did gymnastics.     Past Surgical History:   Procedure Laterality Date   ??? HX APPENDECTOMY     ??? HX CESAREAN SECTION     ??? HX COLONOSCOPY     ??? HX ORTHOPAEDIC  1982    Broken right hand   ???  HX OTHER SURGICAL      Dental implants   ??? HX PARTIAL HYSTERECTOMY      Removal of both fallopian tubes and one ovary.     Family History   Problem Relation Age of Onset   ??? Cancer Mother      breast   ??? Stroke Father    ??? Heart Disease Father    ??? Hypertension Father    ??? Heart Disease Maternal Grandmother    ??? Heart Failure Maternal Grandmother    ??? Heart Disease Maternal Grandfather    ??? No Known Problems Paternal Grandmother    ??? Heart Attack Paternal Grandfather      Social History     Social History   ??? Marital status: MARRIED     Spouse name: N/A   ??? Number of children: N/A   ??? Years of education: N/A     Occupational History    ??? Not on file.     Social History Main Topics   ??? Smoking status: Former Smoker     Packs/day: 1.00     Years: 15.00     Types: Cigarettes     Start date: 04/09/1978     Quit date: 04/09/1993   ??? Smokeless tobacco: Never Used   ??? Alcohol use Yes      Comment: 2 glasses of wine a day   ??? Drug use: No   ??? Sexual activity: Yes     Partners: Male     Birth control/ protection: None     Other Topics Concern   ??? Military Service No   ??? Blood Transfusions No   ??? Caffeine Concern No   ??? Occupational Exposure No   ??? Hobby Hazards No   ??? Sleep Concern No   ??? Stress Concern Yes     Job related   ??? Weight Concern No   ??? Special Diet No   ??? Back Care Yes   ??? Exercise Yes   ??? Seat Belt Yes   ??? Self-Exams Yes     Social History Narrative         Review of Systems   Constitutional: Negative.  Negative for diaphoresis, fever and malaise/fatigue.   Respiratory: Positive for shortness of breath.    Cardiovascular: Positive for palpitations. Negative for chest pain, orthopnea, claudication, PND and near-syncope.   Gastrointestinal: Positive for heartburn.   Musculoskeletal:        Patient complains of bilateral ankle pain   Neurological: Negative.  Negative for dizziness, weakness and numbness.   Endo/Heme/Allergies: Negative.    Psychiatric/Behavioral: Negative.      Visit Vitals   ??? BP 100/64 (BP 1 Location: Left arm, BP Patient Position: Sitting)   ??? Pulse 82   ??? Temp 97.9 ??F (36.6 ??C) (Oral)   ??? Resp 16   ??? Ht 5' (1.524 m)   ??? Wt 125 lb (56.7 kg)   ??? SpO2 98%   ??? BMI 24.41 kg/m2       Physical Exam   Constitutional: She is oriented to person, place, and time. She appears well-developed and well-nourished.   HENT:   Head: Normocephalic and atraumatic.   Cardiovascular: Normal rate, regular rhythm, normal heart sounds and intact distal pulses.  Exam reveals no gallop and no friction rub.    No murmur heard.  Pulmonary/Chest: Effort normal and breath sounds normal. No respiratory distress. She has no wheezes. She has no rales.    Musculoskeletal: She exhibits  no edema.   Neurological: She is alert and oriented to person, place, and time. No cranial nerve deficit.   Psychiatric: She has a normal mood and affect. Her behavior is normal. Judgment and thought content normal.   Nursing note and vitals reviewed.      ASSESSMENT and PLAN    ICD-10-CM ICD-9-CM    1. Palpitations R00.2 785.1 AMB POC EKG ROUTINE W/ 12 LEADS, INTER & REP      EVENT MONITOR POST SYMPTOMS      REFERRAL TO CARDIOLOGY   2. Bilateral ankle pain, unspecified chronicity M25.571 719.47 REFERRAL TO ORTHOPEDICS    M25.572       Follow-up Disposition:  Return for Dr. Claiborne Billings tomorrow at 1:45 PM.

## 2015-05-01 NOTE — Progress Notes (Signed)
Jessica Stanley is a 55 y.o. female here today for ankle pain. Patient also stated that her heart has been racing in the middle of the night, and at random times.

## 2015-05-02 ENCOUNTER — Ambulatory Visit
Admit: 2015-05-02 | Discharge: 2015-05-02 | Payer: PRIVATE HEALTH INSURANCE | Attending: Cardiovascular Disease | Primary: Family Medicine

## 2015-05-02 DIAGNOSIS — R002 Palpitations: Secondary | ICD-10-CM

## 2015-05-02 NOTE — Progress Notes (Signed)
Subjective:      Jessica Stanley is in the office today for cardiac evaluation.     She is a 55 year old woman who was referred for further evaluation of palpitations that she has been experiencing lately.      The patient related that on a number of occasions, while completely asleep, she has been awakened with a racing heartbeat.  She said it generally takes about five minutes for it to calm down.  At times, she has taken one-half of a Xanax to see if she can relax.  She initially thought it was related to stress, but with the persistent symptoms, she is now not as convinced.  In the past, she has had occasional palpitations ??? once every couple of months or so.  She is physically active and relates that she can walk miles without stopping for shortness of breath.  She has had no peripheral swelling.  She had no chest pain.  She had no near syncope or syncope.                     Patient's cardiac risk factors are family history.        Patient Active Problem List    Diagnosis Date Noted   ??? Palpitations 05/06/2015   ??? Family history of premature CAD 05/06/2015   ??? Gastroesophageal reflux disease without esophagitis 11/15/2014     Current Outpatient Prescriptions   Medication Sig Dispense Refill   ??? pantoprazole (PROTONIX) 20 mg tablet TAKE 1 TABLET DAILY FOR GASTROESOPHAGEAL REFLUX 90 Tab 1   ??? chlorpheniramine-HYDROcodone (TUSSIONEX PENNKINETIC ER) 10-8 mg/5 mL suspension Take 5 mL by mouth every twelve (12) hours as needed for Cough. Max Daily Amount: 10 mL. 60 mL 0   ??? ibuprofen (MOTRIN) 600 mg tablet Take 1 Tab by mouth every eight (8) hours as needed for Pain (take with food). 30 Tab 0   ??? albuterol (PROVENTIL HFA, VENTOLIN HFA, PROAIR HFA) 90 mcg/actuation inhaler Take 2 Puffs by inhalation every four (4) hours as needed for Wheezing. 1 Inhaler 1   ??? multivitamin (ONE A DAY) tablet Take 1 Tab by mouth daily.     ??? ALPRAZolam (XANAX) 0.25 mg tablet Take 0.25 mg by mouth nightly as needed.      ??? ibuprofen (ADVIL) 200 mg tablet Take 600 mg by mouth every eight (8) hours as needed.     ??? cholecalciferol, vitamin D3, (VITAMIN D3) 2,000 unit tab Take 4,000 Units by mouth daily.       Allergies   Allergen Reactions   ??? Pcn [Penicillins] Angioedema     Past Medical History:   Diagnosis Date   ??? Anxiety    ??? Arthritis     djd changes noted on 2015 lumbar xray   ??? GERD (gastroesophageal reflux disease)     she reports she had upper endoscopy 2015, with no Barretts found   ??? History of ankle fracture     right ankle twice, left ankle once; previously did gymnastics.     Past Surgical History:   Procedure Laterality Date   ??? HX APPENDECTOMY     ??? HX CESAREAN SECTION     ??? HX COLONOSCOPY     ??? HX ORTHOPAEDIC  1982    Broken right hand   ??? HX OTHER SURGICAL      Dental implants   ??? HX PARTIAL HYSTERECTOMY      Removal of both fallopian tubes and one ovary.  Family History   Problem Relation Age of Onset   ??? Cancer Mother      breast   ??? Stroke Father    ??? Heart Disease Father    ??? Hypertension Father    ??? Heart Disease Maternal Grandmother    ??? Heart Failure Maternal Grandmother    ??? Heart Disease Maternal Grandfather    ??? No Known Problems Paternal Grandmother    ??? Heart Attack Paternal Grandfather      History   Smoking Status   ??? Former Smoker   ??? Packs/day: 1.00   ??? Years: 15.00   ??? Types: Cigarettes   ??? Start date: 04/09/1978   ??? Quit date: 04/09/1993   Smokeless Tobacco   ??? Never Used          Review of Systems, additional:  Constitutional: negative  Eyes: negative  Respiratory: negative  Cardiovascular: positive for palpitations  Gastrointestinal: negative  Musculoskeletal:negative  Neurological: negative  Behvioral/Psych: negative  Endocrine: negative  ENT: negative    Objective:     Visit Vitals   ??? BP 126/74   ??? Pulse (!) 105   ??? Ht 5' (1.524 m)   ??? Wt 57.6 kg (127 lb)   ??? SpO2 96%   ??? BMI 24.8 kg/m2     General:  alert, cooperative, no distress    Chest Wall: inspection normal - no chest wall deformities or tenderness, respiratory effort normal   Lung: clear to auscultation bilaterally   Heart:  normal rate and regular rhythm, S1 and S2 normal, no murmurs noted, no gallops noted   Abdomen: soft, non-tender. Bowel sounds normal. No masses,  no organomegaly   Extremities: extremities normal, atraumatic, no cyanosis or edema Skin: no rashes   Neuro: alert, oriented, normal speech, no focal findings or movement disorder noted     EKG: 05/01/2015; Sinus rhythm. PAC.    Assessment/Plan:       ICD-10-CM ICD-9-CM    1. Palpitations, Event monitor ordered. Will order Echocardiogram and see in RT in 4 weeks. R00.2 785.1 2D ECHO COMPLETE ADULT (TTE) W OR WO CONTR   2. Gastroesophageal reflux disease without esophagitis K21.9 530.81    3. Family history of premature CAD Z82.49 V17.3

## 2015-05-06 DIAGNOSIS — R002 Palpitations: Secondary | ICD-10-CM

## 2015-05-13 NOTE — Telephone Encounter (Signed)
Patient called and states she has on event monitor, but is having a reaction to the patches. She states the area is red and sore. She called [employer] and requested different patches, but they instructed her to call here.     I sent email to Leandra Kernlement Brock at [employer] to let him know she needs new patches.     Patient is also going to call [employer] back and let them know that she contacted our office.

## 2015-05-15 ENCOUNTER — Inpatient Hospital Stay: Admit: 2015-05-15 | Payer: BLUE CROSS/BLUE SHIELD | Attending: Cardiovascular Disease | Primary: Family Medicine

## 2015-05-15 DIAGNOSIS — I081 Rheumatic disorders of both mitral and tricuspid valves: Secondary | ICD-10-CM

## 2015-05-16 NOTE — Telephone Encounter (Signed)
Patient calling and states that every night since last Tuesday she wakes up around 1-2 in the morning and is up for over 2 hours with rapid heart rate. She has to take Xanax. She thinks it may be panic attacks or anxiety. They Xanax helps her calm down.    Event monitor transmission thus far have been ok. There was one transmission that was sinus tach that was at night.   Her echocardiogram looks good, still awaiting Dr. Camelia Engallaghans final review.  Scheduled her an appointment to see him next week. She is going to also call Dr. Valda FaviaGarris to see if she needs an appointment with them to further evaluation of anxiety/depression that may be causing her to wake up with the increased heart rate.     She will call back if she has any other questions or concerns before next Friday's appointment.

## 2015-05-20 ENCOUNTER — Ambulatory Visit
Admit: 2015-05-20 | Discharge: 2015-05-20 | Payer: PRIVATE HEALTH INSURANCE | Attending: Internal Medicine | Primary: Family Medicine

## 2015-05-20 ENCOUNTER — Inpatient Hospital Stay: Admit: 2015-05-20 | Payer: BLUE CROSS/BLUE SHIELD | Primary: Family Medicine

## 2015-05-20 ENCOUNTER — Ambulatory Visit: Attending: Internal Medicine | Primary: Family Medicine

## 2015-05-20 DIAGNOSIS — R002 Palpitations: Secondary | ICD-10-CM

## 2015-05-20 MED ORDER — CLONAZEPAM 0.5 MG TAB, RAPID DISSOLVE
0.5 mg | ORAL_TABLET | Freq: Three times a day (TID) | ORAL | 5 refills | Status: DC
Start: 2015-05-20 — End: 2015-05-27

## 2015-05-20 NOTE — Progress Notes (Signed)
Jessica Stanley is a 55 y.o. female here today to follow up on cardiac testing results and anxiety

## 2015-05-20 NOTE — Progress Notes (Signed)
HISTORY OF PRESENT ILLNESS  Jessica Stanley is a 55 y.o. female here for follow-up of palpitations and anxiety and depression.  Patient states that anxiety is becoming more frequent and she is having anxiety attacks with palpitations sweating and tremulousness on it daily basis..  Anxiety   The history is provided by the patient and medical records. This is a recurrent problem. The problem has been gradually worsening. Associated symptoms include shortness of breath. Pertinent negatives include no chest pain, no abdominal pain and no headaches. The symptoms are aggravated by stress. Nothing relieves the symptoms. Treatments tried: Xanax. The treatment provided moderate relief.   Depression   The history is provided by the patient. This is a recurrent problem. The problem has been gradually worsening. Associated symptoms include shortness of breath. Pertinent negatives include no chest pain, no abdominal pain and no headaches. The symptoms are aggravated by stress. Nothing relieves the symptoms. She has tried nothing for the symptoms.   Palpitations    The history is provided by the patient and medical records. This is a recurrent problem. The problem has not changed since onset.The problem is associated with anxiety. Associated symptoms include diaphoresis, irregular heartbeat and shortness of breath. Pertinent negatives include no malaise/fatigue, no numbness, no chest pain, no chest pressure, no claudication, no exertional chest pressure, no near-syncope, no syncope, no abdominal pain, no vomiting, no headaches, no leg pain and no dizziness. Risk factors include stress. Her past medical history does not include hyperthyroidism, valve disorder, anemia, heart disease, DM, past MI, hypertension, atrial fibrillation or SVT.   Insomnia   The history is provided by the patient. This is a new problem. The problem has been gradually worsening. Associated symptoms include shortness of  breath. Pertinent negatives include no chest pain, no abdominal pain and no headaches. Nothing aggravates the symptoms. Nothing relieves the symptoms. She has tried nothing for the symptoms.     Allergies   Allergen Reactions   ??? Pcn [Penicillins] Angioedema     Current Outpatient Prescriptions on File Prior to Visit   Medication Sig Dispense Refill   ??? pantoprazole (PROTONIX) 20 mg tablet TAKE 1 TABLET DAILY FOR GASTROESOPHAGEAL REFLUX 90 Tab 1   ??? ibuprofen (MOTRIN) 600 mg tablet Take 1 Tab by mouth every eight (8) hours as needed for Pain (take with food). 30 Tab 0   ??? albuterol (PROVENTIL HFA, VENTOLIN HFA, PROAIR HFA) 90 mcg/actuation inhaler Take 2 Puffs by inhalation every four (4) hours as needed for Wheezing. 1 Inhaler 1   ??? multivitamin (ONE A DAY) tablet Take 1 Tab by mouth daily.     ??? ALPRAZolam (XANAX) 0.25 mg tablet Take 0.25 mg by mouth nightly as needed.     ??? ibuprofen (ADVIL) 200 mg tablet Take 600 mg by mouth every eight (8) hours as needed.     ??? cholecalciferol, vitamin D3, (VITAMIN D3) 2,000 unit tab Take 4,000 Units by mouth daily.       No current facility-administered medications on file prior to visit.      Past Medical History:   Diagnosis Date   ??? Anxiety    ??? Arthritis     djd changes noted on 2015 lumbar xray   ??? GERD (gastroesophageal reflux disease)     she reports she had upper endoscopy 2015, with no Barretts found   ??? History of ankle fracture     right ankle twice, left ankle once; previously did gymnastics.     Past Surgical  History:   Procedure Laterality Date   ??? HX APPENDECTOMY     ??? HX CESAREAN SECTION     ??? HX COLONOSCOPY     ??? HX ORTHOPAEDIC  1982    Broken right hand   ??? HX OTHER SURGICAL      Dental implants   ??? HX PARTIAL HYSTERECTOMY      Removal of both fallopian tubes and one ovary.     Family History   Problem Relation Age of Onset   ??? Cancer Mother      breast   ??? Stroke Father    ??? Heart Disease Father    ??? Hypertension Father     ??? Heart Disease Maternal Grandmother    ??? Heart Failure Maternal Grandmother    ??? Heart Disease Maternal Grandfather    ??? No Known Problems Paternal Grandmother    ??? Heart Attack Paternal Grandfather      Social History     Social History   ??? Marital status: MARRIED     Spouse name: N/A   ??? Number of children: N/A   ??? Years of education: N/A     Occupational History   ??? Not on file.     Social History Main Topics   ??? Smoking status: Former Smoker     Packs/day: 1.00     Years: 15.00     Types: Cigarettes     Start date: 04/09/1978     Quit date: 04/09/1993   ??? Smokeless tobacco: Never Used   ??? Alcohol use Yes      Comment: 2 glasses of wine a day   ??? Drug use: No   ??? Sexual activity: Yes     Partners: Male     Birth control/ protection: None     Other Topics Concern   ??? Military Service No   ??? Blood Transfusions No   ??? Caffeine Concern No   ??? Occupational Exposure No   ??? Hobby Hazards No   ??? Sleep Concern No   ??? Stress Concern Yes     Job related   ??? Weight Concern No   ??? Special Diet No   ??? Back Care Yes   ??? Exercise Yes   ??? Seat Belt Yes   ??? Self-Exams Yes     Social History Narrative         Review of Systems   Constitutional: Positive for diaphoresis. Negative for malaise/fatigue.   Respiratory: Positive for shortness of breath.    Cardiovascular: Positive for palpitations. Negative for chest pain, claudication, syncope and near-syncope.   Gastrointestinal: Negative for abdominal pain and vomiting.   Musculoskeletal: Negative.    Neurological: Negative.  Negative for dizziness, numbness and headaches.   Endo/Heme/Allergies: Negative.    Psychiatric/Behavioral: Positive for depression. The patient is nervous/anxious and has insomnia.      Visit Vitals   ??? BP 100/60   ??? Pulse 70   ??? Temp 96.8 ??F (36 ??C) (Oral)   ??? Resp 16   ??? Ht 5' (1.524 m)   ??? Wt 130 lb (59 kg)   ??? SpO2 100%   ??? BMI 25.39 kg/m2       Physical Exam   Constitutional: She is oriented to person, place, and time. She appears  well-developed and well-nourished.   HENT:   Head: Normocephalic and atraumatic.   Cardiovascular: Normal rate, regular rhythm, normal heart sounds and intact distal pulses.  Exam reveals no gallop and  no friction rub.    No murmur heard.  Pulmonary/Chest: Effort normal and breath sounds normal. No respiratory distress. She has no wheezes. She has no rales.   Musculoskeletal: She exhibits no edema.   Neurological: She is alert and oriented to person, place, and time. No cranial nerve deficit.   Psychiatric: She has a normal mood and affect. Her behavior is normal. Judgment and thought content normal.   Nursing note and vitals reviewed.      ASSESSMENT and PLAN    ICD-10-CM ICD-9-CM    1. Palpitations R00.2 785.1 THYROID CASCADE PROFILE   2. Anxiety F41.9 300.00 REFERRAL TO PSYCHIATRY   3. Depression, unspecified depression type F32.9 311 REFERRAL TO PSYCHIATRY   4. Insomnia, unspecified type G47.00 780.52      Follow-up Disposition:  Return in about 1 week (around 05/27/2015) for 30 min.  the following changes in treatment are made: Patient will be started on clonazepam 0.5 mg every 8 hours for anxiety.

## 2015-05-21 LAB — THYROID CASCADE PROFILE: TSH: 1.36 u[IU]/mL (ref 0.450–4.500)

## 2015-05-22 MED ORDER — CLONAZEPAM 0.25 MG TAB, RAPID DISSOLVE
0.25 mg | ORAL_TABLET | Freq: Three times a day (TID) | ORAL | 0 refills | Status: DC
Start: 2015-05-22 — End: 2016-06-08

## 2015-05-22 NOTE — Telephone Encounter (Signed)
Spoke with patient and advised her that script is available for p/u.

## 2015-05-22 NOTE — Telephone Encounter (Signed)
Pt calling to go over thyroid lab results and to have klonopin odt switched b/c it is too strong

## 2015-05-22 NOTE — Telephone Encounter (Signed)
Dr. Valda FaviaGarris,    Please advise.      Thank you.

## 2015-05-23 ENCOUNTER — Encounter: Attending: Cardiovascular Disease | Primary: Family Medicine

## 2015-05-26 NOTE — Telephone Encounter (Signed)
Please call Dr. Judithann SaugerPatricia King to schedule an appointment for patient today or tomorrow.  If office states that there are none available please let the staff know that I spoke with Dr. Brooke DareKing personally and she is willing to see the patient today or tomorrow.

## 2015-05-27 ENCOUNTER — Ambulatory Visit
Admit: 2015-05-27 | Discharge: 2015-05-27 | Payer: PRIVATE HEALTH INSURANCE | Attending: Internal Medicine | Primary: Family Medicine

## 2015-05-27 ENCOUNTER — Encounter: Attending: Internal Medicine | Primary: Family Medicine

## 2015-05-27 DIAGNOSIS — J029 Acute pharyngitis, unspecified: Secondary | ICD-10-CM

## 2015-05-27 LAB — AMB POC RAPID STREP A: Group A Strep Ag: NEGATIVE

## 2015-05-27 MED ORDER — BENZONATATE 200 MG CAP
200 mg | ORAL_CAPSULE | Freq: Three times a day (TID) | ORAL | 1 refills | Status: AC | PRN
Start: 2015-05-27 — End: 2015-06-03

## 2015-05-27 MED ORDER — AZITHROMYCIN 250 MG TAB
250 mg | ORAL_TABLET | ORAL | 0 refills | Status: DC
Start: 2015-05-27 — End: 2015-07-22

## 2015-05-27 NOTE — Progress Notes (Signed)
HISTORY OF PRESENT ILLNESS  Jessica Stanley is a 55 y.o. female here for follow-up of anxiety.  Patient is also complaining of upper respiratory tract symptoms sore throat chest tightness and body aches as well as headache. .  Anxiety   The history is provided by the patient and medical records. This is a recurrent problem. The problem has been gradually improving. Pertinent negatives include no chest pain, no abdominal pain, no headaches and no shortness of breath. The symptoms are aggravated by stress. The symptoms are relieved by medications. Treatments tried: Klonopin.   Depression   The history is provided by the patient. This is a new problem. The problem occurs constantly. The problem has not changed since onset.Pertinent negatives include no chest pain, no abdominal pain, no headaches and no shortness of breath. The symptoms are aggravated by stress. Nothing relieves the symptoms. She has tried nothing for the symptoms.   Sore Throat    The history is provided by the patient. This is a new problem. The problem has not changed since onset.There has been no fever. Associated symptoms include cough. Pertinent negatives include no congestion, no ear pain, no headaches, no shortness of breath, no swollen glands, no trouble swallowing and no stiff neck. She has had no exposure to strep. She has tried nothing for the symptoms.   Cough   The history is provided by the patient. This is a new problem. The problem occurs constantly. The problem has not changed since onset.Pertinent negatives include no chest pain, no abdominal pain, no headaches and no shortness of breath. Nothing aggravates the symptoms. Nothing relieves the symptoms. She has tried nothing for the symptoms.     Allergies   Allergen Reactions   ??? Pcn [Penicillins] Angioedema     Current Outpatient Prescriptions on File Prior to Visit   Medication Sig Dispense Refill   ??? clonazePAM (KLONOPIN) 0.25 mg disintegrating tablet Take 1 Tab by mouth  three (3) times daily. Max Daily Amount: 0.75 mg. 90 Tab 0   ??? pantoprazole (PROTONIX) 20 mg tablet TAKE 1 TABLET DAILY FOR GASTROESOPHAGEAL REFLUX 90 Tab 1   ??? ibuprofen (MOTRIN) 600 mg tablet Take 1 Tab by mouth every eight (8) hours as needed for Pain (take with food). 30 Tab 0   ??? albuterol (PROVENTIL HFA, VENTOLIN HFA, PROAIR HFA) 90 mcg/actuation inhaler Take 2 Puffs by inhalation every four (4) hours as needed for Wheezing. 1 Inhaler 1   ??? multivitamin (ONE A DAY) tablet Take 1 Tab by mouth daily.     ??? ibuprofen (ADVIL) 200 mg tablet Take 600 mg by mouth every eight (8) hours as needed.     ??? cholecalciferol, vitamin D3, (VITAMIN D3) 2,000 unit tab Take 4,000 Units by mouth daily.       No current facility-administered medications on file prior to visit.      Past Medical History:   Diagnosis Date   ??? Anxiety    ??? Arthritis     djd changes noted on 2015 lumbar xray   ??? GERD (gastroesophageal reflux disease)     she reports she had upper endoscopy 2015, with no Barretts found   ??? History of ankle fracture     right ankle twice, left ankle once; previously did gymnastics.     Past Surgical History:   Procedure Laterality Date   ??? HX APPENDECTOMY     ??? HX CESAREAN SECTION     ??? HX COLONOSCOPY     ???  HX ORTHOPAEDIC  1982    Broken right hand   ??? HX OTHER SURGICAL      Dental implants   ??? HX PARTIAL HYSTERECTOMY      Removal of both fallopian tubes and one ovary.     Family History   Problem Relation Age of Onset   ??? Cancer Mother      breast   ??? Stroke Father    ??? Heart Disease Father    ??? Hypertension Father    ??? Heart Disease Maternal Grandmother    ??? Heart Failure Maternal Grandmother    ??? Heart Disease Maternal Grandfather    ??? No Known Problems Paternal Grandmother    ??? Heart Attack Paternal Grandfather      Social History     Social History   ??? Marital status: MARRIED     Spouse name: N/A   ??? Number of children: N/A   ??? Years of education: N/A     Occupational History   ??? Not on file.      Social History Main Topics   ??? Smoking status: Former Smoker     Packs/day: 1.00     Years: 15.00     Types: Cigarettes     Start date: 04/09/1978     Quit date: 04/09/1993   ??? Smokeless tobacco: Never Used   ??? Alcohol use Yes      Comment: 2 glasses of wine a day   ??? Drug use: No   ??? Sexual activity: Yes     Partners: Male     Birth control/ protection: None     Other Topics Concern   ??? Military Service No   ??? Blood Transfusions No   ??? Caffeine Concern No   ??? Occupational Exposure No   ??? Hobby Hazards No   ??? Sleep Concern No   ??? Stress Concern Yes     Job related   ??? Weight Concern No   ??? Special Diet No   ??? Back Care Yes   ??? Exercise Yes   ??? Seat Belt Yes   ??? Self-Exams Yes     Social History Narrative         Review of Systems   Constitutional: Negative.    HENT: Positive for sore throat. Negative for congestion, ear pain and trouble swallowing.    Respiratory: Positive for cough. Negative for shortness of breath and wheezing.    Cardiovascular: Negative.  Negative for chest pain.   Gastrointestinal: Negative for abdominal pain.   Musculoskeletal: Negative.    Neurological: Negative.  Negative for headaches.   Endo/Heme/Allergies: Negative.    Psychiatric/Behavioral: Positive for depression. The patient is nervous/anxious.      Visit Vitals   ??? BP 102/64 (BP 1 Location: Left arm, BP Patient Position: Sitting)   ??? Pulse 78   ??? Temp 98 ??F (36.7 ??C) (Oral)   ??? Resp 16   ??? Ht 5' (1.524 m)   ??? Wt 137 lb (62.1 kg)   ??? SpO2 98%   ??? BMI 26.76 kg/m2       Physical Exam   Constitutional: She is oriented to person, place, and time. She appears well-developed and well-nourished.   HENT:   Head: Normocephalic and atraumatic.   Cardiovascular: Normal rate, regular rhythm, normal heart sounds and intact distal pulses.  Exam reveals no gallop and no friction rub.    No murmur heard.  Pulmonary/Chest: Effort normal and breath sounds normal. No respiratory  distress. She has no wheezes. She has no rales.    Musculoskeletal: Normal range of motion. She exhibits no edema or deformity.   Neurological: She is alert and oriented to person, place, and time. No cranial nerve deficit.   Psychiatric: She has a normal mood and affect. Her behavior is normal. Judgment and thought content normal.   Nursing note and vitals reviewed.      ASSESSMENT and PLAN  Sore throat Z-Pak  Anxiety    patient will continue with present dose of Klonopin.  We will continue to attempt to get her in with psychiatry ASAP  Depression   Cough   Tessalon Perles    Follow-up Disposition:  Return in about 6 weeks (around 07/08/2015).

## 2015-05-27 NOTE — Progress Notes (Signed)
Jessica Stanley is a 55 y.o. female presents to office for follow up on depression and anxiety     Health Maintenance items with a due date reviewed with patient:  Health Maintenance Due   Topic Date Due   ??? Hepatitis C Screening  1960-04-21   ??? DTaP/Tdap/Td series (1 - Tdap) 02/22/1980   ??? PAP AKA CERVICAL CYTOLOGY  11/30/2014   ??? FOBT Q 1 YEAR AGE 57-75  03/29/2015

## 2015-05-29 NOTE — Telephone Encounter (Signed)
Called and left a message with receptionist for Dr. Brooke DareKing to see patient urgent appointment. Receptionist stated that she will let Dr. Brooke DareKing know about this.

## 2015-05-31 NOTE — Telephone Encounter (Signed)
Pt states benzonatate (TESSALON) 200 mg capsule  Is not working for her and she would like a new med called in b/c she is having horrible coughing spells.

## 2015-06-02 ENCOUNTER — Ambulatory Visit
Admit: 2015-06-02 | Discharge: 2015-06-02 | Payer: PRIVATE HEALTH INSURANCE | Attending: Internal Medicine | Primary: Family Medicine

## 2015-06-02 ENCOUNTER — Encounter: Admit: 2015-06-02 | Primary: Family Medicine

## 2015-06-02 DIAGNOSIS — R059 Cough, unspecified: Secondary | ICD-10-CM

## 2015-06-02 MED ORDER — FLUTICASONE 110 MCG/ACTUATION AEROSOL INHALER
110 mcg/actuation | Freq: Two times a day (BID) | RESPIRATORY_TRACT | 1 refills | Status: DC
Start: 2015-06-02 — End: 2015-07-22

## 2015-06-02 MED ORDER — ALBUTEROL SULFATE HFA 90 MCG/ACTUATION AEROSOL INHALER
90 mcg/actuation | RESPIRATORY_TRACT | 1 refills | Status: DC | PRN
Start: 2015-06-02 — End: 2015-06-25

## 2015-06-02 NOTE — Progress Notes (Unsigned)
1. Have you been to the ER, urgent care clinic since your last visit?  Hospitalized since your last visit?no    2. Have you seen or consulted any other health care providers outside of the [facility] since your last visit?  Include any pap smears or colon screening. No    Patient Jessica Stanley is a 55 y.o. female presents today for cough.

## 2015-06-02 NOTE — Telephone Encounter (Signed)
Please advise.

## 2015-06-06 ENCOUNTER — Ambulatory Visit: Admit: 2015-06-06 | Payer: PRIVATE HEALTH INSURANCE | Attending: Cardiovascular Disease | Primary: Family Medicine

## 2015-06-06 DIAGNOSIS — R002 Palpitations: Secondary | ICD-10-CM

## 2015-06-06 NOTE — Progress Notes (Signed)
Subjective:      Jessica Stanley is in the office today for cardiac reevaluation.     She is a 55 year old woman who was referred for further evaluation of palpitations that she had been experiencing in the last several weeks.        At the time of her initial appointment on 05/02/2015, the patient related that on a number of occasions, while completely asleep, she had been awakened with a racing heartbeat.  She said it would generally take five minutes or so for it to calm down.  She would take one-half of a Xanax to see if she could relax, and that would help.  She initially thought it was related to stress, but because of the persistent symptoms, she became less convinced.  She is physically active and can walk miles without stopping for shortness of breath.  She has had no peripheral swelling.  She has had no chest pain.  She has had no near syncope or syncope.      An echocardiogram and a thirty day event monitor were ordered.  The event monitor did not show any significant arrhythmia.  The echocardiogram demonstrated normal systolic function.  There was no significant valvular pathology.      In the office today, she says she is still having some symptoms.  She again relates that when she is completely asleep, around 2:30 a.m., she will awaken with these episodes.  She has been taking Klonopin three times per day.  She relates that in the last four weeks, these episodes have occurred about twenty-five times.                         Patient Active Problem List    Diagnosis Date Noted   ??? Palpitations 05/06/2015   ??? Family history of premature CAD 05/06/2015   ??? Gastroesophageal reflux disease without esophagitis 11/15/2014     Current Outpatient Prescriptions   Medication Sig Dispense Refill   ??? albuterol (PROVENTIL HFA, VENTOLIN HFA, PROAIR HFA) 90 mcg/actuation inhaler Take 2 Puffs by inhalation every four (4) hours as needed for Wheezing. 1 Inhaler 1    ??? fluticasone (FLOVENT HFA) 110 mcg/actuation inhaler Take 2 Puffs by inhalation every twelve (12) hours. 1 Inhaler 1   ??? azithromycin (ZITHROMAX) 250 mg tablet Take 1 Tab by mouth See Admin Instructions. 6 Tab 0   ??? clonazePAM (KLONOPIN) 0.25 mg disintegrating tablet Take 1 Tab by mouth three (3) times daily. Max Daily Amount: 0.75 mg. 90 Tab 0   ??? pantoprazole (PROTONIX) 20 mg tablet TAKE 1 TABLET DAILY FOR GASTROESOPHAGEAL REFLUX 90 Tab 1   ??? ibuprofen (MOTRIN) 600 mg tablet Take 1 Tab by mouth every eight (8) hours as needed for Pain (take with food). 30 Tab 0   ??? albuterol (PROVENTIL HFA, VENTOLIN HFA, PROAIR HFA) 90 mcg/actuation inhaler Take 2 Puffs by inhalation every four (4) hours as needed for Wheezing. 1 Inhaler 1   ??? multivitamin (ONE A DAY) tablet Take 1 Tab by mouth daily.     ??? ibuprofen (ADVIL) 200 mg tablet Take 600 mg by mouth every eight (8) hours as needed.     ??? cholecalciferol, vitamin D3, (VITAMIN D3) 2,000 unit tab Take 4,000 Units by mouth daily.       Allergies   Allergen Reactions   ??? Pcn [Penicillins] Angioedema     Past Medical History:   Diagnosis Date   ??? Anxiety    ???  Arthritis     djd changes noted on 2015 lumbar xray   ??? GERD (gastroesophageal reflux disease)     she reports she had upper endoscopy 2015, with no Barretts found   ??? History of ankle fracture     right ankle twice, left ankle once; previously did gymnastics.     Past Surgical History:   Procedure Laterality Date   ??? HX APPENDECTOMY     ??? HX CESAREAN SECTION     ??? HX COLONOSCOPY     ??? HX ORTHOPAEDIC  1982    Broken right hand   ??? HX OTHER SURGICAL      Dental implants   ??? HX PARTIAL HYSTERECTOMY      Removal of both fallopian tubes and one ovary.     Family History   Problem Relation Age of Onset   ??? Cancer Mother      breast   ??? Stroke Father    ??? Heart Disease Father    ??? Hypertension Father    ??? Heart Disease Maternal Grandmother    ??? Heart Failure Maternal Grandmother    ??? Heart Disease Maternal Grandfather     ??? No Known Problems Paternal Grandmother    ??? Heart Attack Paternal Grandfather      History   Smoking Status   ??? Former Smoker   ??? Packs/day: 1.00   ??? Years: 15.00   ??? Types: Cigarettes   ??? Start date: 04/09/1978   ??? Quit date: 04/09/1993   Smokeless Tobacco   ??? Never Used          Review of Systems, additional:  Constitutional: negative  Eyes: negative  Respiratory: negative  Cardiovascular: positive for palpitations  Gastrointestinal: negative  Musculoskeletal:negative  Neurological: negative  Behvioral/Psych: negative  Endocrine: negative  ENT: negative    Objective:     Visit Vitals   ??? BP 111/64   ??? Pulse 69   ??? Ht 5' (1.524 m)   ??? Wt 58.1 kg (128 lb)   ??? SpO2 98%   ??? BMI 25 kg/m2     General:  alert, cooperative, no distress   Chest Wall: inspection normal - no chest wall deformities or tenderness, respiratory effort normal   Lung: clear to auscultation bilaterally   Heart:  normal rate and regular rhythm, S1 and S2 normal, no murmurs noted, no gallops noted   Abdomen: soft, non-tender. Bowel sounds normal. No masses,  no organomegaly   Extremities: extremities normal, atraumatic, no cyanosis or edema Skin: no rashes   Neuro: alert, oriented, normal speech, no focal findings or movement disorder noted         Assessment/Plan:         ICD-10-CM ICD-9-CM    1. Palpitations, 30 day event monitor with no significant arrhythmia, take Clonopin 5 mg at night, switch H2 blocker and PPI time of administration. RT 4 to 6 weeks. R00.2 785.1    2. Gastroesophageal reflux disease without esophagitis K21.9 530.81    3. Family history of premature CAD Z82.49 V17.3

## 2015-06-23 NOTE — Telephone Encounter (Signed)
Patient called and reports that she is again having spells where she is waking up from sleep with increased heart rate -varies from 120-128 bpm. She takes deep breaths for several minutes and her heart rate will slowly come back down. She is taking Klonopin still. Her father passed away 5 days ago and she states her stress level is "through the roof." She is seeing Dr. Valda FaviaGarris this week to see about changing her medication and she is also going to see a therapist this week as well.     Advised her that I will talk to Dr. Duanne Moronallaghan tomorrow to see if there is anything else that he recommends.

## 2015-06-25 ENCOUNTER — Encounter: Attending: Internal Medicine | Primary: Family Medicine

## 2015-06-25 ENCOUNTER — Ambulatory Visit
Admit: 2015-06-25 | Discharge: 2015-06-25 | Payer: PRIVATE HEALTH INSURANCE | Attending: Internal Medicine | Primary: Family Medicine

## 2015-06-25 DIAGNOSIS — F419 Anxiety disorder, unspecified: Secondary | ICD-10-CM

## 2015-06-25 MED ORDER — ALPRAZOLAM 1 MG TAB
1 mg | ORAL_TABLET | Freq: Every evening | ORAL | 2 refills | Status: DC | PRN
Start: 2015-06-25 — End: 2015-09-02

## 2015-06-25 MED ORDER — CLONAZEPAM 0.25 MG TAB, RAPID DISSOLVE
0.25 mg | ORAL_TABLET | Freq: Three times a day (TID) | ORAL | 4 refills | Status: DC
Start: 2015-06-25 — End: 2015-07-22

## 2015-06-25 NOTE — Progress Notes (Signed)
Jessica Stanley is a 55 y.o. female presents to office for CPE and follow up on depression and anxiety. Patient stated that the klonopin is not working. Patient stated that she is waking up at night with the elevated heart rate. Patient stated that she has contacted her cardiologist.        Health Maintenance items with a due date reviewed with patient:  Health Maintenance Due   Topic Date Due   ??? Hepatitis C Screening  28-Jun-1960   ??? DTaP/Tdap/Td series (1 - Tdap) 02/22/1980   ??? PAP AKA CERVICAL CYTOLOGY  11/30/2014   ??? FOBT Q 1 YEAR AGE 41-75  03/29/2015

## 2015-06-25 NOTE — Progress Notes (Signed)
HISTORY OF PRESENT ILLNESS  Jessica Stanley is a 55 y.o. female here for follow-up on anxiety and depression.  Patient states that her father passed on June 15, 2015.  She is still experiencing periods of insomnia associated with diffuse sweating and palpitations.  She has been evaluated by cardiology who feels that this is mainly anxiety associated.  Anxiety   The history is provided by the patient and medical records. This is a chronic problem. The problem has been gradually worsening. Associated symptoms include shortness of breath. Pertinent negatives include no chest pain and no headaches. The symptoms are aggravated by stress. The symptoms are relieved by medications. Treatments tried: Klonopin and Xanax. The treatment provided moderate relief.   Depression   The history is provided by the patient and medical records. This is a chronic problem. The problem has been gradually worsening. Associated symptoms include shortness of breath. Pertinent negatives include no chest pain and no headaches. The symptoms are aggravated by stress. Nothing relieves the symptoms.     Allergies   Allergen Reactions   ??? Pcn [Penicillins] Angioedema     Current Outpatient Prescriptions on File Prior to Visit   Medication Sig Dispense Refill   ??? fluticasone (FLOVENT HFA) 110 mcg/actuation inhaler Take 2 Puffs by inhalation every twelve (12) hours. 1 Inhaler 1   ??? azithromycin (ZITHROMAX) 250 mg tablet Take 1 Tab by mouth See Admin Instructions. 6 Tab 0   ??? clonazePAM (KLONOPIN) 0.25 mg disintegrating tablet Take 1 Tab by mouth three (3) times daily. Max Daily Amount: 0.75 mg. 90 Tab 0   ??? pantoprazole (PROTONIX) 20 mg tablet TAKE 1 TABLET DAILY FOR GASTROESOPHAGEAL REFLUX 90 Tab 1   ??? ibuprofen (MOTRIN) 600 mg tablet Take 1 Tab by mouth every eight (8) hours as needed for Pain (take with food). 30 Tab 0   ??? multivitamin (ONE A DAY) tablet Take 1 Tab by mouth daily.      ??? ibuprofen (ADVIL) 200 mg tablet Take 600 mg by mouth every eight (8) hours as needed.     ??? cholecalciferol, vitamin D3, (VITAMIN D3) 2,000 unit tab Take 4,000 Units by mouth daily.     ??? albuterol (PROVENTIL HFA, VENTOLIN HFA, PROAIR HFA) 90 mcg/actuation inhaler Take 2 Puffs by inhalation every four (4) hours as needed for Wheezing. 1 Inhaler 1     No current facility-administered medications on file prior to visit.      Past Medical History:   Diagnosis Date   ??? Anxiety    ??? Arthritis     djd changes noted on 2015 lumbar xray   ??? GERD (gastroesophageal reflux disease)     she reports she had upper endoscopy 2015, with no Barretts found   ??? History of ankle fracture     right ankle twice, left ankle once; previously did gymnastics.     Past Surgical History:   Procedure Laterality Date   ??? HX APPENDECTOMY     ??? HX CESAREAN SECTION     ??? HX COLONOSCOPY     ??? HX ORTHOPAEDIC  1982    Broken right hand   ??? HX OTHER SURGICAL      Dental implants   ??? HX PARTIAL HYSTERECTOMY      Removal of both fallopian tubes and one ovary.     Family History   Problem Relation Age of Onset   ??? Cancer Mother      breast   ??? Stroke Father    ???  Heart Disease Father    ??? Hypertension Father    ??? Heart Disease Maternal Grandmother    ??? Heart Failure Maternal Grandmother    ??? Heart Disease Maternal Grandfather    ??? No Known Problems Paternal Grandmother    ??? Heart Attack Paternal Grandfather      Social History     Social History   ??? Marital status: MARRIED     Spouse name: N/A   ??? Number of children: N/A   ??? Years of education: N/A     Occupational History   ??? Not on file.     Social History Main Topics   ??? Smoking status: Former Smoker     Packs/day: 1.00     Years: 15.00     Types: Cigarettes     Start date: 04/09/1978     Quit date: 04/09/1993   ??? Smokeless tobacco: Never Used   ??? Alcohol use Yes      Comment: 2 glasses of wine a day   ??? Drug use: No   ??? Sexual activity: Yes     Partners: Male     Birth control/ protection: None      Other Topics Concern   ??? Military Service No   ??? Blood Transfusions No   ??? Caffeine Concern No   ??? Occupational Exposure No   ??? Hobby Hazards No   ??? Sleep Concern No   ??? Stress Concern Yes     Job related   ??? Weight Concern No   ??? Special Diet No   ??? Back Care Yes   ??? Exercise Yes   ??? Seat Belt Yes   ??? Self-Exams Yes     Social History Narrative       Review of Systems   Constitutional: Negative.    Respiratory: Positive for shortness of breath.    Cardiovascular: Positive for palpitations. Negative for chest pain.   Neurological: Negative.  Negative for headaches.   Endo/Heme/Allergies: Negative.    Psychiatric/Behavioral: Positive for depression.     Visit Vitals   ??? BP 110/62 (BP 1 Location: Right arm, BP Patient Position: Sitting)   ??? Pulse 75   ??? Temp 97.3 ??F (36.3 ??C) (Oral)   ??? Resp 16   ??? Ht 5' (1.524 m)   ??? Wt 127 lb (57.6 kg)   ??? SpO2 99%   ??? BMI 24.8 kg/m2       Physical Exam   Constitutional: She is oriented to person, place, and time. She appears well-developed and well-nourished.   HENT:   Head: Normocephalic and atraumatic.   Cardiovascular: Normal rate, regular rhythm, normal heart sounds and intact distal pulses.  Exam reveals no gallop and no friction rub.    No murmur heard.  Pulmonary/Chest: Effort normal and breath sounds normal. No respiratory distress. She has no wheezes. She has no rales.   Musculoskeletal: Normal range of motion. She exhibits no edema, tenderness or deformity.   Neurological: She is alert and oriented to person, place, and time. No cranial nerve deficit.   Skin: Skin is warm and dry.   Psychiatric: She has a normal mood and affect. Her behavior is normal. Thought content normal.   Nursing note and vitals reviewed.      ASSESSMENT and PLAN    ICD-10-CM ICD-9-CM    1. Anxiety F41.9 300.00    2. 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 patient will be started on Xanax 1 mg at bedtime.

## 2015-06-25 NOTE — Progress Notes (Signed)
HISTORY OF PRESENT ILLNESS  Jessica Stanley is a 55 y.o. female.Presents today for a complete physical and preventative medicine exam.  Past medical history is significant for: Tachycardia anxiety and depression GERD  Last colonoscopy 2015 normal but is scheduled for another in 2018  Last eye examination June 2016  Last pelvic exam September 2016  Last mammogram September 2016  Last dental exam 2 weeks ago    Complete Physical   The history is provided by the patient and medical records. Pertinent negatives include no chest pain, no abdominal pain and no shortness of breath.     Allergies   Allergen Reactions   ??? Pcn [Penicillins] Angioedema     Current Outpatient Prescriptions on File Prior to Visit   Medication Sig Dispense Refill   ??? fluticasone (FLOVENT HFA) 110 mcg/actuation inhaler Take 2 Puffs by inhalation every twelve (12) hours. 1 Inhaler 1   ??? azithromycin (ZITHROMAX) 250 mg tablet Take 1 Tab by mouth See Admin Instructions. 6 Tab 0   ??? clonazePAM (KLONOPIN) 0.25 mg disintegrating tablet Take 1 Tab by mouth three (3) times daily. Max Daily Amount: 0.75 mg. 90 Tab 0   ??? pantoprazole (PROTONIX) 20 mg tablet TAKE 1 TABLET DAILY FOR GASTROESOPHAGEAL REFLUX 90 Tab 1   ??? ibuprofen (MOTRIN) 600 mg tablet Take 1 Tab by mouth every eight (8) hours as needed for Pain (take with food). 30 Tab 0   ??? multivitamin (ONE A DAY) tablet Take 1 Tab by mouth daily.     ??? ibuprofen (ADVIL) 200 mg tablet Take 600 mg by mouth every eight (8) hours as needed.     ??? cholecalciferol, vitamin D3, (VITAMIN D3) 2,000 unit tab Take 4,000 Units by mouth daily.     ??? albuterol (PROVENTIL HFA, VENTOLIN HFA, PROAIR HFA) 90 mcg/actuation inhaler Take 2 Puffs by inhalation every four (4) hours as needed for Wheezing. 1 Inhaler 1     No current facility-administered medications on file prior to visit.      Past Medical History:   Diagnosis Date   ??? Anxiety    ??? Arthritis     djd changes noted on 2015 lumbar xray    ??? GERD (gastroesophageal reflux disease)     she reports she had upper endoscopy 2015, with no Barretts found   ??? History of ankle fracture     right ankle twice, left ankle once; previously did gymnastics.     Past Surgical History:   Procedure Laterality Date   ??? HX APPENDECTOMY     ??? HX CESAREAN SECTION     ??? HX COLONOSCOPY     ??? HX ORTHOPAEDIC  1982    Broken right hand   ??? HX OTHER SURGICAL      Dental implants   ??? HX PARTIAL HYSTERECTOMY      Removal of both fallopian tubes and one ovary.     Family History   Problem Relation Age of Onset   ??? Cancer Mother      breast   ??? Stroke Father    ??? Heart Disease Father    ??? Hypertension Father    ??? Heart Disease Maternal Grandmother    ??? Heart Failure Maternal Grandmother    ??? Heart Disease Maternal Grandfather    ??? No Known Problems Paternal Grandmother    ??? Heart Attack Paternal Grandfather      Social History     Social History   ??? Marital status: MARRIED  Spouse name: N/A   ??? Number of children: N/A   ??? Years of education: N/A     Occupational History   ??? Not on file.     Social History Main Topics   ??? Smoking status: Former Smoker     Packs/day: 1.00     Years: 15.00     Types: Cigarettes     Start date: 04/09/1978     Quit date: 04/09/1993   ??? Smokeless tobacco: Never Used   ??? Alcohol use Yes      Comment: 2 glasses of wine a day   ??? Drug use: No   ??? Sexual activity: Yes     Partners: Male     Birth control/ protection: None     Other Topics Concern   ??? Military Service No   ??? Blood Transfusions No   ??? Caffeine Concern No   ??? Occupational Exposure No   ??? Hobby Hazards No   ??? Sleep Concern No   ??? Stress Concern Yes     Job related   ??? Weight Concern No   ??? Special Diet No   ??? Back Care Yes   ??? Exercise Yes   ??? Seat Belt Yes   ??? Self-Exams Yes     Social History Narrative         Review of Systems   Constitutional: Positive for diaphoresis. Negative for chills, fever, malaise/fatigue and weight loss.         Patient does experience unusual sweating with the palpitations at night   HENT: Negative.  Negative for congestion, ear discharge, ear pain, hearing loss, nosebleeds, sore throat and tinnitus.    Eyes: Positive for photophobia. Negative for blurred vision, double vision, pain, discharge and redness.   Respiratory: Negative.  Negative for cough, hemoptysis, sputum production, shortness of breath and wheezing.    Cardiovascular: Positive for palpitations and PND. Negative for chest pain, orthopnea, claudication and leg swelling.   Gastrointestinal: Positive for heartburn. Negative for abdominal pain, blood in stool, constipation, diarrhea, melena, nausea and vomiting.   Genitourinary: Negative.  Negative for dysuria, flank pain, frequency, hematuria and urgency.   Musculoskeletal: Negative.  Negative for back pain, joint pain, myalgias and neck pain.   Skin: Negative.  Negative for itching and rash.   Neurological: Negative.  Negative for dizziness, tingling, tremors, sensory change, speech change, focal weakness, seizures, loss of consciousness and weakness.   Endo/Heme/Allergies: Negative.  Negative for environmental allergies and polydipsia. Does not bruise/bleed easily.   Psychiatric/Behavioral: Positive for depression. Negative for hallucinations, memory loss, substance abuse and suicidal ideas. The patient is nervous/anxious and has insomnia.      Visit Vitals   ??? BP 110/62 (BP 1 Location: Right arm, BP Patient Position: Sitting)   ??? Pulse 75   ??? Temp 97.3 ??F (36.3 ??C) (Oral)   ??? Resp 16   ??? Ht 5' (1.524 m)   ??? Wt 127 lb (57.6 kg)   ??? SpO2 99%   ??? BMI 24.8 kg/m2       Physical Exam   Constitutional: She is oriented to person, place, and time. She appears well-developed and well-nourished.   HENT:   Head: Normocephalic and atraumatic.   Right Ear: External ear normal.   Left Ear: External ear normal.   Nose: Nose normal.   Mouth/Throat: Oropharynx is clear and moist. No oropharyngeal exudate.    Eyes: Conjunctivae and EOM are normal. Pupils are equal, round, and reactive to light.  Right eye exhibits no discharge. Left eye exhibits no discharge. No scleral icterus.   Neck: Normal range of motion. Neck supple. No JVD present. No tracheal deviation present. No thyromegaly present.   Cardiovascular: Normal rate, regular rhythm, normal heart sounds and intact distal pulses.  Exam reveals no gallop and no friction rub.    No murmur heard.  Pulmonary/Chest: Effort normal and breath sounds normal. No respiratory distress. She has no wheezes. She has no rales.   Abdominal: Soft. Bowel sounds are normal. She exhibits no distension and no mass. There is no tenderness. There is no rebound and no guarding.   Musculoskeletal: Normal range of motion. She exhibits no edema or tenderness.   Lymphadenopathy:     She has no cervical adenopathy.   Neurological: She is alert and oriented to person, place, and time. No cranial nerve deficit. Coordination normal.   Skin: Skin is warm and dry. No rash noted. No erythema.   Psychiatric: She has a normal mood and affect. Her behavior is normal. Judgment and thought content normal.   Nursing note and vitals reviewed.      ASSESSMENT and PLAN    ICD-10-CM ICD-9-CM    1. Anxiety F41.9 300.00    2. 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 Routine general medical examination at a health care facility Z00.00 V70.0 CBC WITH AUTOMATED DIFF      METABOLIC PANEL, COMPREHENSIVE      LIPID PANEL      URINALYSIS W/ RFLX MICROSCOPIC      OCCULT BLOOD, IMMUNOASSAY (FIT)   4. Screening for colon cancer Z12.11 V76.51 OCCULT BLOOD, IMMUNOASSAY (FIT)   5. Screening for breast cancer Z12.39 V76.10 MAM MAMMO BI SCREENING INCL CAD   6. Screening for diabetes mellitus Z13.1 V77.1 HEMOGLOBIN A1C WITH EAG   7. Screening for cholesterol level Z13.220 V77.91 LIPID PANEL     Follow-up Disposition:  Return in about 4 weeks (around 07/23/2015).

## 2015-06-26 LAB — MICROSCOPIC EXAMINATION: Casts: NONE SEEN /lpf

## 2015-06-26 LAB — CBC WITH AUTOMATED DIFF
ABS. BASOPHILS: 0 10*3/uL (ref 0.0–0.2)
ABS. EOSINOPHILS: 0.3 10*3/uL (ref 0.0–0.4)
ABS. IMM. GRANS.: 0 10*3/uL (ref 0.0–0.1)
ABS. MONOCYTES: 0.4 10*3/uL (ref 0.1–0.9)
ABS. NEUTROPHILS: 2.4 10*3/uL (ref 1.4–7.0)
Abs Lymphocytes: 1.5 10*3/uL (ref 0.7–3.1)
BASOPHILS: 1 %
EOSINOPHILS: 6 %
HCT: 40.6 % (ref 34.0–46.6)
HGB: 13.6 g/dL (ref 11.1–15.9)
IMMATURE GRANULOCYTES: 0 %
Lymphocytes: 33 %
MCH: 29.7 pg (ref 26.6–33.0)
MCHC: 33.5 g/dL (ref 31.5–35.7)
MCV: 89 fL (ref 79–97)
MONOCYTES: 8 %
NEUTROPHILS: 52 %
PLATELET: 250 10*3/uL (ref 150–379)
RBC: 4.58 x10E6/uL (ref 3.77–5.28)
RDW: 12.8 % (ref 12.3–15.4)
WBC: 4.5 10*3/uL (ref 3.4–10.8)

## 2015-06-26 LAB — METABOLIC PANEL, COMPREHENSIVE
A-G Ratio: 1.6 (ref 1.2–2.2)
ALT (SGPT): 16 IU/L (ref 0–32)
AST (SGOT): 21 IU/L (ref 0–40)
Albumin: 4.4 g/dL (ref 3.5–5.5)
Alk. phosphatase: 85 IU/L (ref 39–117)
BUN/Creatinine ratio: 15 (ref 9–23)
BUN: 9 mg/dL (ref 6–24)
Bilirubin, total: 0.5 mg/dL (ref 0.0–1.2)
CO2: 27 mmol/L (ref 18–29)
Calcium: 9.4 mg/dL (ref 8.7–10.2)
Chloride: 101 mmol/L (ref 96–106)
Creatinine: 0.59 mg/dL (ref 0.57–1.00)
GFR est AA: 118 mL/min/{1.73_m2} (ref >59)
GFR est non-AA: 103 mL/min/{1.73_m2} (ref >59)
GLOBULIN, TOTAL: 2.7 g/dL (ref 1.5–4.5)
Glucose: 99 mg/dL (ref 65–99)
Potassium: 4 mmol/L (ref 3.5–5.2)
Protein, total: 7.1 g/dL (ref 6.0–8.5)
Sodium: 141 mmol/L (ref 134–144)

## 2015-06-26 LAB — URINALYSIS W/ RFLX MICROSCOPIC
Bilirubin: NEGATIVE
Blood: NEGATIVE
Glucose: NEGATIVE
Ketone: NEGATIVE
Nitrites: NEGATIVE
Protein: NEGATIVE
Specific Gravity: 1.006 (ref 1.005–1.030)
Urobilinogen: 0.2 mg/dL (ref 0.2–1.0)
pH (UA): 7 (ref 5.0–7.5)

## 2015-06-26 LAB — HEMOGLOBIN A1C WITH EAG
Estimated average glucose: 117 mg/dL
Hemoglobin A1c: 5.7 % — ABNORMAL HIGH (ref 4.8–5.6)

## 2015-06-26 LAB — LIPID PANEL
Cholesterol, total: 193 mg/dL (ref 100–199)
HDL Cholesterol: 116 mg/dL (ref >39)
LDL, calculated: 63 mg/dL (ref 0–99)
Triglyceride: 71 mg/dL (ref 0–149)
VLDL, calculated: 14 mg/dL (ref 5–40)

## 2015-06-26 LAB — CVD REPORT

## 2015-07-02 LAB — OCCULT BLOOD IMMUNOASSAY,DIAGNOSTIC: Occult blood fecal, by IA: NEGATIVE

## 2015-07-07 NOTE — Telephone Encounter (Signed)
Pt called requesting to speak w/ Alyssa b/c the Xanax 1mg  is not helping her stay asleep. She would like to know the max dose daily and to see if she can  Increase the amount she takes at night. Please advise

## 2015-07-17 NOTE — Telephone Encounter (Signed)
Patient has been called and stated that she has not been having problems sleeping. Stated that she was going to be flying and wanted to know how much xanax she was allowed to take. Patient stated that she has been to [place] and back and that she is doing fine. Patient has been scheduled for a follow up apt with Dr. Valda FaviaGarris next week. CLosing encounter.

## 2015-07-21 ENCOUNTER — Encounter: Attending: Internal Medicine | Primary: Family Medicine

## 2015-07-22 ENCOUNTER — Ambulatory Visit
Admit: 2015-07-22 | Discharge: 2015-07-22 | Payer: PRIVATE HEALTH INSURANCE | Attending: Internal Medicine | Primary: Family Medicine

## 2015-07-22 DIAGNOSIS — F419 Anxiety disorder, unspecified: Secondary | ICD-10-CM

## 2015-07-22 NOTE — Progress Notes (Signed)
1. Have you been to the ER, urgent care clinic since your last visit?  Hospitalized since your last visit?no    2. Have you seen or consulted any other health care providers outside of the [facility] since your last visit?  Include any pap smears or colon screening. No    Patient Jessica Stanley is a 55 y.o. female presents today for anxiety f/u.    Wants to know if needs a ct of lungs hx of lung cancer in the family and former smoker.

## 2015-07-22 NOTE — Progress Notes (Signed)
HISTORY OF PRESENT ILLNESS  Jessica Stanley is a 55 y.o. female here for follow-up of anxiety.  Patient states she is still having anxiety attacks that awakes her from her sleep at night associated with palpitations.  Event monitor showed sinus rhythm with heart rate as fast as 120 but no dysrhythmia.  She states that she is no longer using Klonopin during the day but still does take the Xanax 1 mg at nighttime.. Patient has history of 2 pack a day smoking for 10 years.  Her sister died of lung cancer recently and patient wants a CT to check her for lung cancer.  She states that she also wants to be screened for skin cancer and lung cancer since she has a history of excess sun exposure and smoking.  Other   The history is provided by the patient (Patient would like to be screened for both lung cancer and skin cancer). Pertinent negatives include no chest pain, no abdominal pain, no headaches and no shortness of breath.   Anxiety   The history is provided by the patient and medical records. This is a chronic problem. The problem has not changed since onset.Pertinent negatives include no chest pain, no abdominal pain, no headaches and no shortness of breath. Nothing aggravates the symptoms. The symptoms are relieved by medications. Treatments tried: Xanax. The treatment provided moderate relief.     Allergies   Allergen Reactions   ??? Pcn [Penicillins] Angioedema     Current Outpatient Prescriptions on File Prior to Visit   Medication Sig Dispense Refill   ??? FAMOTIDINE PO Take  by mouth.     ??? ALPRAZolam (XANAX) 1 mg tablet Take 1 Tab by mouth nightly as needed for Anxiety. Max Daily Amount: 1 mg. 30 Tab 2   ??? clonazePAM (KLONOPIN) 0.25 mg disintegrating tablet Take 1 Tab by mouth three (3) times daily. Max Daily Amount: 0.75 mg. 90 Tab 0   ??? pantoprazole (PROTONIX) 20 mg tablet TAKE 1 TABLET DAILY FOR GASTROESOPHAGEAL REFLUX 90 Tab 1   ??? ibuprofen (MOTRIN) 600 mg tablet Take 1 Tab by mouth every eight (8)  hours as needed for Pain (take with food). 30 Tab 0   ??? multivitamin (ONE A DAY) tablet Take 1 Tab by mouth daily.     ??? ibuprofen (ADVIL) 200 mg tablet Take 600 mg by mouth every eight (8) hours as needed.     ??? cholecalciferol, vitamin D3, (VITAMIN D3) 2,000 unit tab Take 4,000 Units by mouth daily.       No current facility-administered medications on file prior to visit.        Past Surgical History:   Procedure Laterality Date   ??? HX APPENDECTOMY     ??? HX CESAREAN SECTION     ??? HX COLONOSCOPY     ??? HX ORTHOPAEDIC  1982    Broken right hand   ??? HX OTHER SURGICAL      Dental implants   ??? HX PARTIAL HYSTERECTOMY      Removal of both fallopian tubes and one ovary.     Family History   Problem Relation Age of Onset   ??? Cancer Mother      breast   ??? Stroke Father    ??? Heart Disease Father    ??? Hypertension Father    ??? Heart Disease Maternal Grandmother    ??? Heart Failure Maternal Grandmother    ??? Heart Disease Maternal Grandfather    ??? No Known  Problems Paternal Grandmother    ??? Heart Attack Paternal Grandfather      Social History     Social History   ??? Marital status: MARRIED     Spouse name: N/A   ??? Number of children: N/A   ??? Years of education: N/A     Occupational History   ??? Not on file.     Social History Main Topics   ??? Smoking status: Former Smoker     Packs/day: 1.00     Years: 15.00     Types: Cigarettes     Start date: 04/09/1978     Quit date: 04/09/1993   ??? Smokeless tobacco: Never Used   ??? Alcohol use Yes      Comment: 2 glasses of wine a day   ??? Drug use: No   ??? Sexual activity: Yes     Partners: Male     Birth control/ protection: None     Other Topics Concern   ??? Military Service No   ??? Blood Transfusions No   ??? Caffeine Concern No   ??? Occupational Exposure No   ??? Hobby Hazards No   ??? Sleep Concern No   ??? Stress Concern Yes     Job related   ??? Weight Concern No   ??? Special Diet No   ??? Back Care Yes   ??? Exercise Yes   ??? Seat Belt Yes   ??? Self-Exams Yes     Social History Narrative          Review of Systems   Constitutional: Negative.    Respiratory: Negative.  Negative for shortness of breath.    Cardiovascular: Positive for palpitations. Negative for chest pain.        Patient awakens during the night with palpitations and extreme anxiety   Gastrointestinal: Negative for abdominal pain.   Musculoskeletal: Negative.    Skin: Negative.    Neurological: Negative.  Negative for headaches.   Endo/Heme/Allergies: Negative.    Psychiatric/Behavioral: The patient is nervous/anxious and has insomnia.      Visit Vitals   ??? BP 100/64   ??? Pulse 70   ??? Temp 97.2 ??F (36.2 ??C) (Oral)   ??? Resp 16   ??? Ht 5' (1.524 m)   ??? Wt 125 lb (56.7 kg)   ??? SpO2 98%   ??? BMI 24.41 kg/m2       Physical Exam   Constitutional: She is oriented to person, place, and time. She appears well-developed and well-nourished.   HENT:   Head: Normocephalic and atraumatic.   Cardiovascular: Normal rate, regular rhythm, normal heart sounds and intact distal pulses.  Exam reveals no gallop and no friction rub.    No murmur heard.  Pulmonary/Chest: Effort normal and breath sounds normal. No respiratory distress. She has no wheezes. She has no rales.   Musculoskeletal: Normal range of motion. She exhibits no edema, tenderness or deformity.   Neurological: She is alert and oriented to person, place, and time. No cranial nerve deficit.   Psychiatric: She has a normal mood and affect. Her behavior is normal. Judgment and thought content normal.   Vitals reviewed.      ASSESSMENT and PLAN    ICD-10-CM ICD-9-CM    1. Anxiety F41.9 300.00 REFERRAL TO PSYCHIATRY   2. Skin problem L98.9 709.9 REFERRAL TO DERMATOLOGY   3. Heart palpitations R00.2 785.1    4. Encounter for screening for lung cancer Z12.2 V76.0 CT CHEST WO CONT  Follow-up Disposition:  Return in about 3 months (around 10/22/2015).

## 2015-07-23 ENCOUNTER — Encounter: Attending: Cardiovascular Disease | Primary: Family Medicine

## 2015-07-24 ENCOUNTER — Telehealth

## 2015-07-24 NOTE — Telephone Encounter (Signed)
Patient called and stated that [employer] is not covering her CT of lungs. Patient stated that she spoke to her insurance company and she can get a CT lung cancer screening low dose and pay out of pocket. Please see pended referral. Patient also stated that she would like her referral to psych to be done asap, stated that she has tried to contact Dr. Brooke DareKing but has gotten no response. Patient was given the [facility] and stated that she will call them to try and set up an appointment. Patient stated that she would like her Husband to have the CT done as well. Please see patients chart with order. Thank you!

## 2015-08-20 ENCOUNTER — Ambulatory Visit
Admit: 2015-08-20 | Discharge: 2015-08-20 | Payer: PRIVATE HEALTH INSURANCE | Attending: Internal Medicine | Primary: Family Medicine

## 2015-08-20 DIAGNOSIS — R490 Dysphonia: Secondary | ICD-10-CM

## 2015-08-20 NOTE — Progress Notes (Signed)
Chief Complaint   Patient presents with   ??? Hoarse     For the last three weeks with some loss of voice   1. Have you been to the ER, urgent care clinic since your last visit?  Hospitalized since your last visit?No    2. Have you seen or consulted any other health care providers outside of the [facility] since your last visit?  Include any pap smears or colon screening. No

## 2015-08-20 NOTE — Progress Notes (Signed)
Subjective:   Jessica Stanley is a 55 y.o.  female who presents with the c/o hoarseness.    HPI: Pt reports a 1 month history of vocal hoarseness that is getting worse.  Pt previously smoked 1 ppd for 15 years before quitting.  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 Recently she states that she has been attending her son's baseball games and is frequently yelling and cheering.  She denies pain, sore throat, cough, fever/chills, history of environmental allergies, or exposure to known irritants.  History is significant for chronic GERD which she takes Protonix and famotidine which appears to control symtpoms.  Pt reports having normal upper endoscopy last year.     Review of Systems   Constitutional: Negative.    HENT: Negative.    Respiratory: Negative.    Cardiovascular: Negative.    Gastrointestinal: Positive for heartburn. Negative for nausea and vomiting.   Skin: Negative.        Current Outpatient Prescriptions on File Prior to Visit   Medication Sig Dispense Refill   ??? FAMOTIDINE PO Take  by mouth.     ??? ALPRAZolam (XANAX) 1 mg tablet Take 1 Tab by mouth nightly as needed for Anxiety. Max Daily Amount: 1 mg. 30 Tab 2   ??? clonazePAM (KLONOPIN) 0.25 mg disintegrating tablet Take 1 Tab by mouth three (3) times daily. Max Daily Amount: 0.75 mg. 90 Tab 0   ??? pantoprazole (PROTONIX) 20 mg tablet TAKE 1 TABLET DAILY FOR GASTROESOPHAGEAL REFLUX 90 Tab 1   ??? multivitamin (ONE A DAY) tablet Take 1 Tab by mouth daily.     ??? cholecalciferol, vitamin D3, (VITAMIN D3) 2,000 unit tab Take 4,000 Units by mouth daily.     ??? ibuprofen (MOTRIN) 600 mg tablet Take 1 Tab by mouth every eight (8) hours as needed for Pain (take with food). 30 Tab 0   ??? ibuprofen (ADVIL) 200 mg tablet Take 600 mg by mouth every eight (8) hours as needed.       No current facility-administered medications on file prior to visit.        Reviewed PmHx, RxHx, FmHx, SocHx, AllgHx and updated and dated in the chart.     Nurse notes were reviewed and are correct    Objective:     Vitals:    08/20/15 0919   BP: 124/76   Pulse: 71   Resp: 18   Temp: 98.8 ??F (37.1 ??C)   TempSrc: Oral   SpO2: 99%   Weight: 125 lb (56.7 kg)   Height: 5' (1.524 m)     Physical Exam   Constitutional: She is oriented to person, place, and time. She appears well-developed and well-nourished.   Vocal hoarseness noted   HENT:   Head: Normocephalic and atraumatic.   Right Ear: External ear normal.   Left Ear: External ear normal.   Nose: Nose normal.   Mouth/Throat: Oropharynx is clear and moist.   Neck: Neck supple.   Cardiovascular: Normal rate and regular rhythm.    Pulmonary/Chest: Effort normal and breath sounds normal.   Lymphadenopathy:     She has no cervical adenopathy.   Neurological: She is alert and oriented to person, place, and time.   Skin: Skin is warm and dry.   Nursing note and vitals reviewed.      Assessment/ Plan:     [name] was seen today for hoarse.    Diagnoses and all orders for this visit:  Hoarseness, persistent        -     Present for 1 month, appears to be getting worse.        -     May be related to GERD, allergies, vocal overuse/misuse        -     Advised trial of vocal rest, trial of antihistamine and/or Flonase, pt requests referral to ENT.  -     REFERRAL TO ENT-OTOLARYNGOLOGY       I have discussed the diagnosis with the patient and the intended plan as seen in the above orders.  The patient verbalized understanding and agrees with the plan.    Follow-up Disposition:  Return if symptoms worsen or fail to improve.    Nance Pew, MD

## 2015-09-02 ENCOUNTER — Encounter: Admit: 2015-09-02 | Primary: Family Medicine

## 2015-09-02 ENCOUNTER — Ambulatory Visit: Admit: 2015-09-02 | Payer: PRIVATE HEALTH INSURANCE | Attending: Internal Medicine | Primary: Family Medicine

## 2015-09-02 DIAGNOSIS — W19XXXA Unspecified fall, initial encounter: Secondary | ICD-10-CM

## 2015-09-02 MED ORDER — ALPRAZOLAM 1 MG TAB
1 mg | ORAL_TABLET | Freq: Every evening | ORAL | 2 refills | Status: AC | PRN
Start: 2015-09-02 — End: ?

## 2015-09-02 NOTE — Progress Notes (Signed)
Jessica Stanley is a 55 y.o. female presents to office for medication refill and rib pain.       1. Have you been to the ER, urgent care clinic or hospitalized since your last visit? no  2. Have you seen any other providers [facility] since your last visit? no  3. Have you had a Flu shot this year? no      Health Maintenance items with a due date reviewed with patient:  Health Maintenance Due   Topic Date Due   ??? Hepatitis C Screening  Oct 23, 1960   ??? DTaP/Tdap/Td series (1 - Tdap) 02/22/1980   ??? PAP AKA CERVICAL CYTOLOGY  11/30/2014   ??? BREAST CANCER SCRN MAMMOGRAM  11/07/2015

## 2015-09-02 NOTE — Progress Notes (Signed)
HISTORY OF PRESENT ILLNESS  Jessica Stanley is a 55 y.o. female here for evaluation of painful left ribs and left shoulder after fall.  She states that she was standing on the side of the tub when she sustained a fall injuring her left shoulder and rib.  She denies any shortness of breath.  She states that she is still having problems with anxiety.  She has chosen to see a psychiatrist who can adjust her medications as necessary..  Anxiety   The history is provided by the patient and medical records. This is a chronic problem. The problem has been gradually improving. Pertinent negatives include no chest pain, no headaches and no shortness of breath. The symptoms are relieved by medications. Treatments tried: Xanax. The treatment provided significant relief.   Fall   The history is provided by the patient. The accident occurred more than 2 days ago. Fall occurred: Follow-up from side of her bathtub. She fell from a height of 3 - 5 ft. She landed on hard floor. The point of impact was the left shoulder. The pain is mild. She was ambulatory at the scene. There was no entrapment after the fall. There was drug use involved in the accident. There was no alcohol use involved in the accident. Pertinent negatives include no visual change, no numbness, no nausea, no vomiting, no headaches, no extremity weakness, no loss of consciousness, no tingling and no laceration. The risk factors include new medication.  The symptoms are aggravated by activity. She has tried nothing for the symptoms.     Allergies   Allergen Reactions   ??? Pcn [Penicillins] Angioedema     Current Outpatient Prescriptions on File Prior to Visit   Medication Sig Dispense Refill   ??? FAMOTIDINE PO Take  by mouth.     ??? clonazePAM (KLONOPIN) 0.25 mg disintegrating tablet Take 1 Tab by mouth three (3) times daily. Max Daily Amount: 0.75 mg. 90 Tab 0   ??? pantoprazole (PROTONIX) 20 mg tablet TAKE 1 TABLET DAILY FOR GASTROESOPHAGEAL REFLUX 90 Tab 1    ??? ibuprofen (MOTRIN) 600 mg tablet Take 1 Tab by mouth every eight (8) hours as needed for Pain (take with food). 30 Tab 0   ??? multivitamin (ONE A DAY) tablet Take 1 Tab by mouth daily.     ??? cholecalciferol, vitamin D3, (VITAMIN D3) 2,000 unit tab Take 4,000 Units by mouth daily.     ??? ibuprofen (ADVIL) 200 mg tablet Take 600 mg by mouth every eight (8) hours as needed.       No current facility-administered medications on file prior to visit.      Past Medical History:   Diagnosis Date   ??? Anxiety    ??? Arthritis     djd changes noted on 2015 lumbar xray   ??? GERD (gastroesophageal reflux disease)     she reports she had upper endoscopy 2015, with no Barretts found   ??? History of ankle fracture     right ankle twice, left ankle once; previously did gymnastics.     Past Surgical History:   Procedure Laterality Date   ??? HX APPENDECTOMY     ??? HX CESAREAN SECTION     ??? HX COLONOSCOPY     ??? HX ORTHOPAEDIC  1982    Broken right hand   ??? HX OTHER SURGICAL      Dental implants   ??? HX PARTIAL HYSTERECTOMY      Removal of both fallopian tubes  and one ovary.     Family History   Problem Relation Age of Onset   ??? Cancer Mother      breast   ??? Stroke Father    ??? Heart Disease Father    ??? Hypertension Father    ??? Heart Disease Maternal Grandmother    ??? Heart Failure Maternal Grandmother    ??? Heart Disease Maternal Grandfather    ??? No Known Problems Paternal Grandmother    ??? Heart Attack Paternal Grandfather      Social History     Social History   ??? Marital status: MARRIED     Spouse name: N/A   ??? Number of children: N/A   ??? Years of education: N/A     Occupational History   ??? Not on file.     Social History Main Topics   ??? Smoking status: Former Smoker     Packs/day: 1.00     Years: 15.00     Types: Cigarettes     Start date: 04/09/1978     Quit date: 04/09/1993   ??? Smokeless tobacco: Never Used   ??? Alcohol use Yes      Comment: 2 glasses of wine a day   ??? Drug use: No   ??? Sexual activity: Yes     Partners: Male      Birth control/ protection: None     Other Topics Concern   ??? Military Service No   ??? Blood Transfusions No   ??? Caffeine Concern No   ??? Occupational Exposure No   ??? Hobby Hazards No   ??? Sleep Concern No   ??? Stress Concern Yes     Job related   ??? Weight Concern No   ??? Special Diet No   ??? Back Care Yes   ??? Exercise Yes   ??? Seat Belt Yes   ??? Self-Exams Yes     Social History Narrative         Review of Systems   Constitutional: Negative.    Respiratory: Negative.  Negative for shortness of breath.    Cardiovascular: Negative.  Negative for chest pain.   Gastrointestinal: Negative for nausea and vomiting.   Musculoskeletal: Negative for extremity weakness.        Left anterior rib pain and left shoulder pain   Neurological: Negative.  Negative for tingling, loss of consciousness, numbness and headaches.   Endo/Heme/Allergies: Negative.    Psychiatric/Behavioral: Negative.      Visit Vitals   ??? BP 106/66 (BP 1 Location: Left arm, BP Patient Position: Sitting)   ??? Pulse 64   ??? Temp 98 ??F (36.7 ??C) (Oral)   ??? Resp 16   ??? Ht 5' (1.524 m)   ??? Wt 127 lb (57.6 kg)   ??? SpO2 100%   ??? BMI 24.8 kg/m2       Physical Exam   Constitutional: She is oriented to person, place, and time. She appears well-developed and well-nourished.   HENT:   Head: Normocephalic and atraumatic.   Cardiovascular: Normal rate, regular rhythm, normal heart sounds and intact distal pulses.  Exam reveals no gallop and no friction rub.    No murmur heard.  Pulmonary/Chest: Effort normal and breath sounds normal. No respiratory distress. She has no wheezes. She has no rales. She exhibits tenderness.   Musculoskeletal: She exhibits tenderness. She exhibits no edema or deformity.   Neurological: She is alert and oriented to person, place, and time. No cranial nerve  deficit. Coordination normal.   Skin: No laceration noted.   Psychiatric: She has a normal mood and affect. Her behavior is normal. Judgment and thought content normal.    Nursing note and vitals reviewed.      ASSESSMENT and PLAN    ICD-10-CM ICD-9-CM    1. Fall, initial encounter W19.XXXA E888.9 XR RIBS LT UNI 2 V      XR SHOULDER LT AP/LAT MIN 2 V   2. Anxiety F41.9 300.00 ALPRAZolam (XANAX) 1 mg tablet     Follow-up Disposition:  Return in about 4 weeks (around 09/30/2015).

## 2015-09-03 NOTE — Telephone Encounter (Signed)
Please call pt to go over x-ray results. Pt states it is ok to leave x-ray results on voicemail.

## 2015-09-08 NOTE — Telephone Encounter (Signed)
Patient requesting results of Xray. Please advise. Thank you!

## 2015-09-11 NOTE — Telephone Encounter (Signed)
Normal

## 2015-09-12 NOTE — Telephone Encounter (Signed)
Patient has been called and notified of results by voicemail (per patient, is okay to leave message with results). Closing encounter.

## 2015-09-22 ENCOUNTER — Ambulatory Visit: Payer: Self-pay | Primary: Family Medicine

## 2015-10-06 ENCOUNTER — Inpatient Hospital Stay: Admit: 2015-10-06 | Payer: Self-pay | Attending: Internal Medicine | Primary: Family Medicine

## 2015-10-06 DIAGNOSIS — Z122 Encounter for screening for malignant neoplasm of respiratory organs: Secondary | ICD-10-CM

## 2015-10-27 ENCOUNTER — Institutional Professional Consult (permissible substitution)
Admit: 2015-10-27 | Discharge: 2015-10-27 | Payer: PRIVATE HEALTH INSURANCE | Attending: Internal Medicine | Primary: Family Medicine

## 2015-10-27 ENCOUNTER — Ambulatory Visit: Admit: 2015-10-27 | Payer: PRIVATE HEALTH INSURANCE | Attending: Internal Medicine | Primary: Family Medicine

## 2015-10-27 DIAGNOSIS — Z23 Encounter for immunization: Secondary | ICD-10-CM

## 2015-10-27 NOTE — Progress Notes (Signed)
Patient here today for Tdap injection. Closing encounter.

## 2015-10-27 NOTE — Progress Notes (Signed)
A user error has taken place: encounter opened in error, closed for administrative reasons.

## 2015-10-30 MED ORDER — PANTOPRAZOLE 20 MG TAB, DELAYED RELEASE
20 mg | ORAL_TABLET | ORAL | 0 refills | Status: DC
Start: 2015-10-30 — End: 2016-01-28

## 2015-12-17 NOTE — Telephone Encounter (Signed)
Called patient and gave results of CT los dose. Patient verbalized understanding of results and is aware that she will need to have q 12 month f/u on this testing. Closing encounter.

## 2015-12-17 NOTE — Telephone Encounter (Signed)
Pt calling to follow up on results of CT. Please advise.

## 2015-12-17 NOTE — Telephone Encounter (Signed)
Patient called back to state that she is worried and would like to see a pulmonologist. Provided patient with the phone number to Dr. David Stallarlos Silva's office.

## 2016-01-21 ENCOUNTER — Encounter: Attending: Internal Medicine | Primary: Family Medicine

## 2016-01-28 MED ORDER — PANTOPRAZOLE 20 MG TAB, DELAYED RELEASE
20 mg | ORAL_TABLET | ORAL | 0 refills | Status: AC
Start: 2016-01-28 — End: ?

## 2016-02-13 ENCOUNTER — Encounter: Attending: Physician Assistant | Primary: Family Medicine

## 2016-02-16 ENCOUNTER — Ambulatory Visit
Admit: 2016-02-16 | Discharge: 2016-02-16 | Payer: PRIVATE HEALTH INSURANCE | Attending: Internal Medicine | Primary: Family Medicine

## 2016-02-16 DIAGNOSIS — J069 Acute upper respiratory infection, unspecified: Secondary | ICD-10-CM

## 2016-02-16 LAB — AMB POC RAPID INFLUENZA TEST: QuickVue Influenza test: NEGATIVE

## 2016-02-16 MED ORDER — AZITHROMYCIN 250 MG TAB
250 mg | ORAL_TABLET | ORAL | 0 refills | Status: AC
Start: 2016-02-16 — End: 2016-02-21

## 2016-02-16 NOTE — Progress Notes (Signed)
HISTORY OF PRESENT ILLNESS  Jessica Stanley is a 55 y.o. female patient states that she for the past 4 days she has noted upper respiratory tract symptoms with nasal congestion.  She states that she had a temperature 101.4 and she feels as though the congestion is moving into her chest.  URI    The history is provided by the patient. This is a new problem. The problem has been gradually worsening. There has been a fever of 101 - 101.9 F. Associated symptoms include ear pain, sore throat and cough. Pertinent negatives include no chest pain, no congestion, no sinus pain and no joint swelling. She has tried nothing for the symptoms.     Allergies   Allergen Reactions   ??? Pcn [Penicillins] Angioedema     Current Outpatient Prescriptions on File Prior to Visit   Medication Sig Dispense Refill   ??? pantoprazole (PROTONIX) 20 mg tablet TAKE 1 TABLET DAILY FOR GASTROESOPHAGEAL REFLUX 90 Tab 0   ??? ALPRAZolam (XANAX) 1 mg tablet Take 1 Tab by mouth nightly as needed for Anxiety. Max Daily Amount: 1 mg. 30 Tab 2   ??? FAMOTIDINE PO Take  by mouth.     ??? clonazePAM (KLONOPIN) 0.25 mg disintegrating tablet Take 1 Tab by mouth three (3) times daily. Max Daily Amount: 0.75 mg. 90 Tab 0   ??? ibuprofen (MOTRIN) 600 mg tablet Take 1 Tab by mouth every eight (8) hours as needed for Pain (take with food). 30 Tab 0   ??? multivitamin (ONE A DAY) tablet Take 1 Tab by mouth daily.     ??? ibuprofen (ADVIL) 200 mg tablet Take 600 mg by mouth every eight (8) hours as needed.     ??? cholecalciferol, vitamin D3, (VITAMIN D3) 2,000 unit tab Take 4,000 Units by mouth daily.       No current facility-administered medications on file prior to visit.      Past Medical History:   Diagnosis Date   ??? Anxiety    ??? Arthritis     djd changes noted on 2015 lumbar xray   ??? GERD (gastroesophageal reflux disease)     she reports she had upper endoscopy 2015, with no Barretts found   ??? History of ankle fracture      right ankle twice, left ankle once; previously did gymnastics.     Past Surgical History:   Procedure Laterality Date   ??? HX APPENDECTOMY     ??? HX CESAREAN SECTION     ??? HX COLONOSCOPY     ??? HX ORTHOPAEDIC  1982    Broken right hand   ??? HX OTHER SURGICAL      Dental implants   ??? HX PARTIAL HYSTERECTOMY      Removal of both fallopian tubes and one ovary.     Family History   Problem Relation Age of Onset   ??? Cancer Mother      breast   ??? Stroke Father    ??? Heart Disease Father    ??? Hypertension Father    ??? Heart Disease Maternal Grandmother    ??? Heart Failure Maternal Grandmother    ??? Heart Disease Maternal Grandfather    ??? No Known Problems Paternal Grandmother    ??? Heart Attack Paternal Grandfather      Social History     Social History   ??? Marital status: MARRIED     Spouse name: N/A   ??? Number of children: N/A   ???  Years of education: N/A     Occupational History   ??? Not on file.     Social History Main Topics   ??? Smoking status: Former Smoker     Packs/day: 1.00     Years: 15.00     Types: Cigarettes     Start date: 04/09/1978     Quit date: 04/09/1993   ??? Smokeless tobacco: Never Used   ??? Alcohol use Yes      Comment: 2 glasses of wine a day   ??? Drug use: No   ??? Sexual activity: Yes     Partners: Male     Birth control/ protection: None     Other Topics Concern   ??? Military Service No   ??? Blood Transfusions No   ??? Caffeine Concern No   ??? Occupational Exposure No   ??? Hobby Hazards No   ??? Sleep Concern No   ??? Stress Concern Yes     Job related   ??? Weight Concern No   ??? Special Diet No   ??? Back Care Yes   ??? Exercise Yes   ??? Seat Belt Yes   ??? Self-Exams Yes     Social History Narrative         Review of Systems   Constitutional: Negative.    HENT: Positive for ear pain and sore throat. Negative for congestion and sinus pain.    Eyes: Negative.    Respiratory: Positive for cough.    Cardiovascular: Negative.  Negative for chest pain.   Musculoskeletal: Negative.    Neurological: Negative.     Endo/Heme/Allergies: Negative.    Psychiatric/Behavioral: Negative.      Visit Vitals   ??? BP 110/70 (BP 1 Location: Right arm, BP Patient Position: Sitting)   ??? Temp 98.7 ??F (37.1 ??C) (Oral)   ??? Resp 18   ??? Ht 5' (1.524 m)   ??? Wt 124 lb 6.4 oz (56.4 kg)   ??? SpO2 99%   ??? BMI 24.3 kg/m2       Physical Exam   Constitutional: She is oriented to person, place, and time. She appears well-developed and well-nourished.   HENT:   Head: Normocephalic and atraumatic.   Cardiovascular: Normal rate, regular rhythm, normal heart sounds and intact distal pulses.  Exam reveals no gallop and no friction rub.    No murmur heard.  Pulmonary/Chest: Effort normal and breath sounds normal. No respiratory distress. She has no wheezes. She has no rales.   Musculoskeletal: Normal range of motion. She exhibits no edema, tenderness or deformity.   Neurological: She is alert and oriented to person, place, and time. No cranial nerve deficit. Coordination normal.   Skin: Skin is warm and dry. No rash noted. No erythema. No pallor.   Psychiatric: She has a normal mood and affect. Her behavior is normal. Judgment and thought content normal.   Nursing note and vitals reviewed.      ASSESSMENT and PLAN    ICD-10-CM ICD-9-CM    1. Upper respiratory tract infection, unspecified type J06.9 465.9 AMB POC RAPID INFLUENZA TEST      azithromycin (ZITHROMAX) 250 mg tablet     Follow-up Disposition:  Return if symptoms worsen or fail to improve.

## 2016-02-16 NOTE — Progress Notes (Signed)
Jessica Stanley is a [age] y.o. female (DOB: 02-Jun-1960) presenting to address:    Chief Complaint   Patient presents with   ??? Cold Symptoms     x 4 days       Vitals:    02/16/16 1451   Weight: 124 lb 6.4 oz (56.4 kg)   Height: 5' (1.524 m)   PainSc:   8   PainLoc: Head       Hearing/Vision:   No exam data present    Learning Assessment:     Learning Assessment 04/09/2013   PRIMARY LEARNER Patient   PRIMARY LANGUAGE ENGLISH   LEARNER PREFERENCE PRIMARY OTHER (COMMENT)   ANSWERED BY patient   RELATIONSHIP SELF     Depression Screening:     PHQ over the last two weeks 05/01/2015   Little interest or pleasure in doing things Not at all   Feeling down, depressed or hopeless Not at all   Total Score PHQ 2 0     Fall Risk Assessment:     Fall Risk Assessment, last 12 mths 04/10/2014   Able to walk? Yes   Fall in past 12 months? No     Abuse Screening:     Abuse Screening Questionnaire 05/01/2015   Do you ever feel afraid of your partner? N   Are you in a relationship with someone who physically or mentally threatens you? N   Is it safe for you to go home? Y     Coordination of Care Questionaire:   1. Have you been to the ER, urgent care clinic since your last visit?  Hospitalized since your last visit? no    2. Have you seen or consulted any other health care providers outside of the [facility] since your last visit?  Include any pap smears or colon screening. no    Advanced Directive:   1. Do you have an Advanced Directive? no    2. Would you like information on Advanced Directives? No

## 2016-03-17 ENCOUNTER — Ambulatory Visit: Attending: Internal Medicine | Primary: Family Medicine

## 2016-03-17 ENCOUNTER — Ambulatory Visit
Admit: 2016-03-18 | Discharge: 2016-03-18 | Payer: PRIVATE HEALTH INSURANCE | Attending: Legal Medicine | Primary: Family Medicine

## 2016-03-24 NOTE — Progress Notes (Signed)
A user error has taken place: encounter opened in error, closed for administrative reasons.

## 2016-05-20 ENCOUNTER — Ambulatory Visit
Admit: 2016-05-20 | Discharge: 2016-05-20 | Payer: PRIVATE HEALTH INSURANCE | Attending: Physician Assistant | Primary: Family Medicine

## 2016-05-20 DIAGNOSIS — J069 Acute upper respiratory infection, unspecified: Secondary | ICD-10-CM

## 2016-05-20 MED ORDER — BROMPHENIRAMINE-PSEUDOEPHEDRINE-DM 2 MG-30 MG-10 MG/5 ML SYRUP
2-30-10 mg/5 mL | Freq: Four times a day (QID) | ORAL | 0 refills | Status: AC | PRN
Start: 2016-05-20 — End: 2016-05-27

## 2016-05-20 NOTE — Progress Notes (Signed)
HISTORY OF PRESENT ILLNESS  Jessica Stanley is a 56 y.o. female presenting with approximately 5 consecutive days of congestion and cough.  She has mild sinus congestion and occasionally nasal congestion and intermittent cough throughout the day.  Is mostly dry occasionally little bit of yellow sputum comes up.  Denies any sinus pain recently.  She has tried a couple of over-the-counter medications which have not seemed to work.  She is trying to keep hydrated.  Denies symptoms being worse today than the previous day.  URI    The history is provided by the patient. This is a new problem. The current episode started more than 2 days ago. The problem has not changed since onset.There has been no fever. Associated symptoms include congestion and cough. Pertinent negatives include no chest pain, no ear pain, no headaches and no sore throat. She has tried increased fluids and other medications for the symptoms. The treatment provided mild relief.     Past Medical History:   Diagnosis Date   ??? Anxiety    ??? Arthritis     djd changes noted on 2015 lumbar xray   ??? GERD (gastroesophageal reflux disease)     she reports she had upper endoscopy 2015, with no Barretts found   ??? History of ankle fracture     right ankle twice, left ankle once; previously did gymnastics.       Current Outpatient Prescriptions:   ???  pantoprazole (PROTONIX) 20 mg tablet, TAKE 1 TABLET DAILY FOR GASTROESOPHAGEAL REFLUX, Disp: 90 Tab, Rfl: 0  ???  ALPRAZolam (XANAX) 1 mg tablet, Take 1 Tab by mouth nightly as needed for Anxiety. Max Daily Amount: 1 mg., Disp: 30 Tab, Rfl: 2  ???  FAMOTIDINE PO, Take  by mouth., Disp: , Rfl:   ???  clonazePAM (KLONOPIN) 0.25 mg disintegrating tablet, Take 1 Tab by mouth three (3) times daily. Max Daily Amount: 0.75 mg., Disp: 90 Tab, Rfl: 0  ???  multivitamin (ONE A DAY) tablet, Take 1 Tab by mouth daily., Disp: , Rfl:   ???  ibuprofen (ADVIL) 200 mg tablet, Take 600 mg by mouth every eight (8) hours as needed., Disp: , Rfl:    ???  clarithromycin (BIAXIN) 500 mg tablet, Take 1 Tab by mouth two (2) times a day for 10 days., Disp: 20 Tab, Rfl: 0  ???  ibuprofen (MOTRIN) 600 mg tablet, Take 1 Tab by mouth every eight (8) hours as needed for Pain (take with food)., Disp: 30 Tab, Rfl: 0  ???  cholecalciferol, vitamin D3, (VITAMIN D3) 2,000 unit tab, Take 4,000 Units by mouth daily., Disp: , Rfl:     Visit Vitals   ??? BP 95/65   ??? Pulse 71   ??? Temp 98 ??F (36.7 ??C) (Oral)   ??? Resp 16   ??? Ht 5' (1.524 m)   ??? Wt 123 lb (55.8 kg)   ??? SpO2 98%   ??? BMI 24.02 kg/m2       Review of Systems   Constitutional: Negative for chills, fever and malaise/fatigue.   HENT: Positive for congestion. Negative for ear pain and sore throat.    Respiratory: Positive for cough. Negative for shortness of breath.    Cardiovascular: Negative for chest pain.   Neurological: Negative for headaches.       Physical Exam   Constitutional: She appears well-nourished. No distress.   HENT:   Right Ear: External ear normal.   Left Ear: External ear normal.  Nose: Nose normal.   Mouth/Throat: Oropharynx is clear and moist.   Neck: Neck supple.   Cardiovascular: Normal rate and regular rhythm.    No murmur heard.  Pulmonary/Chest: Effort normal and breath sounds normal. She has no wheezes. She has no rales.   Lymphadenopathy:     She has no cervical adenopathy.       ASSESSMENT and PLAN    ICD-10-CM ICD-9-CM    1. Viral upper respiratory tract infection J06.9 465.9     B97.89     Patient is instructed to discontinue the over-the-counter medications he recently took since it will seen to be helping.  Patient started on Bromfed-DM which hopefully will help with congestion and cough.  Advised to make sure she is adequately hydrated and to use warm steam humidifier to help with congestion.  If no improvement in 3-5 days and she is instructed to call the office to see about being put on antibiotic.  There is any worsening symptoms and she needs to return to center.  Patient  verbalized understanding and agreed with plan.

## 2016-05-20 NOTE — Progress Notes (Signed)
Jessica Stanley is a 56 y.o. female presents in office to be seen for congestion and cough x 5 days.    Health Maintenance Due   Topic Date Due   ??? Hepatitis C Screening  [birth year]   ??? PAP AKA CERVICAL CYTOLOGY  11/30/2014   ??? BREAST CANCER SCRN MAMMOGRAM  11/07/2015       1. Have you been to the ER, urgent care clinic since your last visit?  Hospitalized since your last visit?no    2. Have you seen or consulted any other health care providers outside of the [facility] since your last visit?  Include any pap smears or colon screening. no

## 2016-05-24 MED ORDER — CLARITHROMYCIN 500 MG TAB
500 mg | ORAL_TABLET | Freq: Two times a day (BID) | ORAL | 0 refills | Status: AC
Start: 2016-05-24 — End: 2016-06-03

## 2016-05-24 NOTE — Telephone Encounter (Signed)
Pt called office still not feeling any better since 05/20/16 appointment was advised to call office if no change and PA Kash would call her in an antibiotic " No problem"     Called and spoke with Nursing, PA Kash was asked if she would call in the Antibiotic, and she requested that the patient come back into the office for evaluation if she was still not feeling better.     The pt was informed of this and became extremely upset stating that "This is bullshit, I have been sick 10 days, I already came in to be seen and agreed to wait for the antibiotic until Monday. I'm not coming back in again for the same issue when she told me she would do this."     Pt stated that she has had issues with her PCP Dr. Valda FaviaGarris, and now with PA Kash. She stated that she would not be coming back to us ever, and would be calling her insurance to change her listed PCP.

## 2016-05-24 NOTE — Telephone Encounter (Signed)
LVM, per PA Kash she will send clarithromycin and f/u in about a week if not better.

## 2016-05-26 ENCOUNTER — Encounter

## 2016-05-26 MED ORDER — AZITHROMYCIN 250 MG TAB
250 mg | ORAL_TABLET | ORAL | 0 refills | Status: AC
Start: 2016-05-26 — End: 2016-05-31

## 2016-06-08 ENCOUNTER — Encounter

## 2016-06-09 MED ORDER — CLONAZEPAM 0.25 MG TAB, RAPID DISSOLVE
0.25 mg | ORAL_TABLET | ORAL | 0 refills | Status: DC
Start: 2016-06-09 — End: 2016-06-09

## 2016-06-09 MED ORDER — CLONAZEPAM 0.25 MG TAB, RAPID DISSOLVE
0.25 mg | ORAL_TABLET | ORAL | 0 refills | Status: AC
Start: 2016-06-09 — End: ?

## 2016-06-09 NOTE — Telephone Encounter (Signed)
Requested Prescriptions     Pending Prescriptions Disp Refills   ??? clonazePAM (KLONOPIN) 0.25 mg disintegrating tablet 50 Tab 0     Sig: DISSOLVE ONE TABLET BY MOUTH THREE TIMES A DAY **MAX 3 TABLETS PER DAY**     Signed Prescriptions Disp Refills   ??? clonazePAM (KLONOPIN) 0.25 mg disintegrating tablet 90 Tab 0     Sig: DISSOLVE ONE TABLET BY MOUTH THREE TIMES A DAY **MAX 3 TABLETS PER DAY**     Authorizing Provider: Christiana Pellant

## 2016-06-09 NOTE — Telephone Encounter (Signed)
No further refills

## 2017-12-21 ENCOUNTER — Encounter: Payer: BLUE CROSS/BLUE SHIELD | Primary: Family Medicine

## 2017-12-30 ENCOUNTER — Inpatient Hospital Stay: Admit: 2017-12-30 | Payer: BLUE CROSS/BLUE SHIELD | Primary: Family Medicine

## 2017-12-30 DIAGNOSIS — M25562 Pain in left knee: Secondary | ICD-10-CM

## 2017-12-30 NOTE — Progress Notes (Signed)
[facility] - [facility] THERAPY    [address],[place], [place] [address]       Phone: [phone]  Fax: [phone]  PLAN OF CARE / STATEMENT OF MEDICAL NECESSITY FOR PHYSICAL THERAPY SERVICES  Patient Name: Jessica Stanley DOB: [birth year]   Medical   Diagnosis: L Knee Pain Treatment Diagnosis: Left knee pain [M25.562]   Onset Date: July 2019     Referral Source: Wynonia Sours, MD Start of Care [facility]): 12/30/2017   Prior Hospitalization: See medical history Provider #: [id number]   Prior Level of Function: Chronic history of difficulty with running, jumping, sport and recreational activities.    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   ===========================================================================================  Assessment / key information:  Patient is a 57 year old female presenting to therapy with signs and symptoms consistent with L knee pain s/p L knee partial medial menisectomy, MFC chondroplasty on 09/13/17. Patient states she underwent physical therapy after surgery and had been doing well, but is still experiencing pain with high level activities. Patient feels most of her pain on the inside of the knee. Patient notes pain with deep squatting, running, jumping and sport activities. Patient notes symptoms feel better with rest. Patient rates pain at worst 10/10 and 0/10 at best.   Objective Data: Inspection-Patient performs body weight squat with excessive anterior tibial translation, heel rise and forward trunk lean. SL squat to table: excess knee valgus angulation, excessive trunk sway. Knee AROM: R 2-0-145, L 0-140. Strength testing reveal gross weakness to B hip musculature into ext and abd. Flexibility Testing: moderate restriction to B quadriceps. (+) half  knee ankle DF bilaterally. Tenderness to L VL and L glute med and L TFL. FOTO: 66/100.     Patient educated on diagnosis, prognosis, POC and HEP. Patient issued copy of HEP and denied additional questions. Patient will benefit from skilled PT in order to address these impairments and functional limitations.   ===========================================================================================  Eval Complexity: History MEDIUM  Complexity : 1-2 comorbidities / personal factors will impact the outcome/ POC ;  Examination  HIGH Complexity : 4+ Standardized tests and measures addressing body structure, function, activity limitation and / or participation in recreation ; Presentation MEDIUM Complexity : Evolving with changing characteristics ;  Decision Making MEDIUM Complexity : FOTO score of 26-74; Overall Complexity MEDIUM  Problem List: pain affecting function, decrease ROM, decrease strength, impaired gait/ balance, decrease ADL/ functional abilitiies, decrease activity tolerance and decrease flexibility/ joint mobility   Treatment Plan may include any combination of the following: Therapeutic exercise, Therapeutic activities, Neuromuscular re-education, Physical agent/modality, Manual therapy, Patient education and Functional mobility training  Patient / Family readiness to learn indicated by: asking questions, trying to perform skills and interest  Persons(s) to be included in education: patient (P)  Barriers to Learning/Limitations: no  Measures taken:    Patient Goal (s): Strengthen quad and muscles around knee in order to doing high level activities   Patient self reported health status: excellent  Rehabilitation Potential: good  . Short Term Goals: To be accomplished in  3  weeks:  1. Patient will demonstrate independence with HEP for self management of symptoms.  2. Patient will demonstrate ability to perform 2x10 BW squats with appropriate technique in order to improve tolerance to household  activities.   Marland Kitchen  Long Term Goals: To be accomplished in  6  weeks:  1. Patient will improve FOTO to >/= 75/100 in order to improve quality of life.  2. Patient will demonstrate ability to perform 2x10 SL squat to parallel with proper technique in order to improve tolerance to recreational activities.  3. Patient will report reduction in pain at worst to 2-3/10 in order to improve tolerance to walking activities.   Frequency / Duration:   Patient to be seen  2  times per week for 6  weeks:  Patient / Caregiver education and instruction: self care, activity modification and exercises  G-Codes (GP): na  Therapist Signature: Jackey Loge, PT Date: 12/30/2017   Certification Period: na Time: 2:11 PM   ===========================================================================================  I certify that the above Physical Therapy Services are being furnished while the patient is under my care.  I agree with the treatment plan and certify that this therapy is necessary.    Physician Signature:        Date:       Time:     Please sign and return to [facility] or you may fax the signed copy to [phone].  Thank you.

## 2017-12-30 NOTE — Progress Notes (Signed)
PHYSICAL THERAPY - DAILY TREATMENT NOTE    Patient Name: Jessica Stanley        Date: 12/30/2017  DOB: 04-30-1960   yes Patient DOB Verified  Visit #:   1   of   12  Insurance: Payor: BLUE CROSS / Plan: [employer] / Product Type: HMO /      In time: 125 Out time: 210   Total Treatment Time: 45     Medicare/BCBS Time Tracking (below)   Total Timed Codes (min):  na 1:1 Treatment Time:  na     TREATMENT AREA =  Left knee pain [M25.562]    SUBJECTIVE  Pain Level (on 0 to 10 scale):  0  / 10   Medication Changes/New allergies or changes in medical history, any new surgeries or procedures?    no  If yes, update Summary List   Subjective Functional Status/Changes:  []   No changes reported     See POC          OBJECTIVE    15 min Therapeutic Exercise:  [x]   See flow sheet   Rationale:      increase ROM and increase strength to improve the patient's ability to perform walking activities.        Billed With/As:   [x]  TE   []  TA   []  Neuro   []  Self Care Patient Education: [x]  Review HEP    []  Progressed/Changed HEP based on:   []  positioning   []  body mechanics   []  transfers   []  heat/ice application    []  other:      Other Objective/Functional Measures:    See POC     Post Treatment Pain Level (on 0 to 10) scale:   0  / 10     ASSESSMENT  Assessment/Changes in Function:     See POC     []   See Progress Note/Recertification   Patient will continue to benefit from skilled PT services to modify and progress therapeutic interventions, address functional mobility deficits, address ROM deficits, address strength deficits, analyze and address soft tissue restrictions, analyze and cue movement patterns, analyze and modify body mechanics/ergonomics and assess and modify postural abnormalities to attain remaining goals.   Progress toward goals / Updated goals:    See POC     PLAN  [x]   Upgrade activities as tolerated yes Continue plan of care   []   Discharge due to :    []   Other:      Therapist: Jackey Loge, PT     Date: 12/30/2017 Time: 2:18 PM     Future Appointments   Date Time Provider Department Center   01/09/2018  1:30 PM Jackey Loge, PT [facility]   01/12/2018  3:30 PM Jackey Loge, PT [facility]   01/16/2018  1:30 PM Jackey Loge, PT [facility]   01/19/2018  3:00 PM Jackey Loge, PT [facility]   01/23/2018  1:30 PM Jackey Loge, PT [facility]   01/26/2018  3:00 PM Jackey Loge, PT [facility]   01/30/2018  1:30 PM Jackey Loge, PT [facility]   02/06/2018  1:30 PM Jackey Loge, PT [facility]   02/09/2018  3:00 PM Jackey Loge, PT [facility]

## 2017-12-30 NOTE — Progress Notes (Signed)
PHYSICAL THERAPY - DAILY TREATMENT NOTE    Patient Name: Jessica Stanley        Date: 12/30/2017  DOB: 01/09/1961   yes Patient DOB Verified  Visit #:   1   of   12  Insurance: Payor: BLUE CROSS / Plan: [employer] / Product Type: HMO /      In time: 125 Out time: 210   Total Treatment Time: 45     Medicare/BCBS Time Tracking (below)   Total Timed Codes (min):  na 1:1 Treatment Time:  na     TREATMENT AREA =  Left knee pain [M25.562]    SUBJECTIVE  Pain Level (on 0 to 10 scale):  0  / 10   Medication Changes/New allergies or changes in medical history, any new surgeries or procedures?    no  If yes, update Summary List   Subjective Functional Status/Changes:  []  No changes reported     See POC          OBJECTIVE    15 min Therapeutic Exercise:  [x]  See flow sheet   Rationale:      increase ROM and increase strength to improve the patient???s ability to perform walking activities.        Billed With/As:   [x] TE   [] TA   [] Neuro   [] Self Care Patient Education: [x] Review HEP    [] Progressed/Changed HEP based on:   [] positioning   [] body mechanics   [] transfers   [] heat/ice application    [] other:      Other Objective/Functional Measures:    See POC     Post Treatment Pain Level (on 0 to 10) scale:   0  / 10     ASSESSMENT  Assessment/Changes in Function:     See POC     []  See Progress Note/Recertification   Patient will continue to benefit from skilled PT services to modify and progress therapeutic interventions, address functional mobility deficits, address ROM deficits, address strength deficits, analyze and address soft tissue restrictions, analyze and cue movement patterns, analyze and modify body mechanics/ergonomics and assess and modify postural abnormalities to attain remaining goals.   Progress toward goals / Updated goals:    See POC     PLAN  [x]  Upgrade activities as tolerated yes Continue plan of care   []  Discharge due to :    []  Other:      Therapist: Rosalita Carey D Forestine Macho, PT     Date: 12/30/2017 Time: 2:18 PM     Future Appointments   Date Time Provider Department Center   01/09/2018  1:30 PM Meris Reede D, PT [facility]   01/12/2018  3:30 PM Sidda Humm D, PT [facility]   01/16/2018  1:30 PM Maybelline Kolarik D, PT [facility]   01/19/2018  3:00 PM Kalei Meda D, PT [facility]   01/23/2018  1:30 PM Micaela Stith D, PT [facility]   01/26/2018  3:00 PM Frankye Schwegel D, PT [facility]   01/30/2018  1:30 PM Alanny Rivers D, PT [facility]   02/06/2018  1:30 PM Nick Stults D, PT [facility]   02/09/2018  3:00 PM Tomesha Sargent D, PT [facility]

## 2017-12-30 NOTE — Progress Notes (Signed)
[facility] ??? [facility] THERAPY    [address], [address],[place], [place] [address]       Phone: [phone]  Fax: [phone]  PLAN OF CARE / STATEMENT OF MEDICAL NECESSITY FOR PHYSICAL THERAPY SERVICES  Patient Name: Jessica Stanley DOB: [birth year]   Medical   Diagnosis: L Knee Pain Treatment Diagnosis: Left knee pain [M25.562]   Onset Date: July 2019     Referral Source: Wynonia Sours, MD Start of Care [facility]): 12/30/2017   Prior Hospitalization: See medical history Provider #: [id number]   Prior Level of Function: Chronic history of difficulty with running, jumping, sport and recreational activities.    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   ===========================================================================================  Assessment / key information:  Patient is a 57 year old female presenting to therapy with signs and symptoms consistent with L knee pain s/p L knee partial medial menisectomy, MFC chondroplasty on 09/13/17. Patient states she underwent physical therapy after surgery and had been doing well, but is still experiencing pain with high level activities. Patient feels most of her pain on the inside of the knee. Patient notes pain with deep squatting, running, jumping and sport activities. Patient notes symptoms feel better with rest. Patient rates pain at worst 10/10 and 0/10 at best.   Objective Data: Inspection-Patient performs body weight squat with excessive anterior tibial translation, heel rise and forward trunk lean. SL squat to table: excess knee valgus angulation, excessive trunk sway. Knee AROM: R 2-0-145, L 0-140. Strength testing reveal gross weakness to B hip musculature into ext and abd. Flexibility Testing: moderate  restriction to B quadriceps. (+) half knee ankle DF bilaterally. Tenderness to L VL and L glute med and L TFL. FOTO: 66/100.     Patient educated on diagnosis, prognosis, POC and HEP. Patient issued copy of HEP and denied additional questions. Patient will benefit from skilled PT in order to address these impairments and functional limitations.   ===========================================================================================  Eval Complexity: History MEDIUM  Complexity : 1-2 comorbidities / personal factors will impact the outcome/ POC ;  Examination  HIGH Complexity : 4+ Standardized tests and measures addressing body structure, function, activity limitation and / or participation in recreation ; Presentation MEDIUM Complexity : Evolving with changing characteristics ;  Decision Making MEDIUM Complexity : FOTO score of 26-74; Overall Complexity MEDIUM  Problem List: pain affecting function, decrease ROM, decrease strength, impaired gait/ balance, decrease ADL/ functional abilitiies, decrease activity tolerance and decrease flexibility/ joint mobility   Treatment Plan may include any combination of the following: Therapeutic exercise, Therapeutic activities, Neuromuscular re-education, Physical agent/modality, Manual therapy, Patient education and Functional mobility training  Patient / Family readiness to learn indicated by: asking questions, trying to perform skills and interest  Persons(s) to be included in education: patient (P)  Barriers to Learning/Limitations: no  Measures taken:    Patient Goal (s): Strengthen quad and muscles around knee in order to doing high level activities   Patient self reported health status: excellent  Rehabilitation Potential: good  ? Short Term Goals: To be accomplished in  3  weeks:  1. Patient will demonstrate independence with HEP for self management of symptoms.  2. Patient will demonstrate ability to perform 2x10 BW squats with  appropriate technique in order to improve tolerance to household activities.   ?  Long Term Goals: To be accomplished in  6  weeks:  1. Patient will improve FOTO to >/= 75/100 in order to improve quality of life.  2. Patient will demonstrate ability to perform 2x10 SL squat to parallel with proper technique in order to improve tolerance to recreational activities.  3. Patient will report reduction in pain at worst to 2-3/10 in order to improve tolerance to walking activities.   Frequency / Duration:   Patient to be seen  2  times per week for 6  weeks:  Patient / Caregiver education and instruction: self care, activity modification and exercises  G-Codes (GP): na  Therapist Signature: Jackey Loge, PT Date: 12/30/2017   Certification Period: na Time: 2:11 PM   ===========================================================================================  I certify that the above Physical Therapy Services are being furnished while the patient is under my care.  I agree with the treatment plan and certify that this therapy is necessary.    Physician Signature:        Date:       Time:     Please sign and return to [facility] or you may fax the signed copy to [phone].  Thank you.

## 2018-01-09 ENCOUNTER — Inpatient Hospital Stay: Admit: 2018-01-09 | Payer: BLUE CROSS/BLUE SHIELD | Primary: Family Medicine

## 2018-01-09 DIAGNOSIS — M25562 Pain in left knee: Secondary | ICD-10-CM

## 2018-01-09 NOTE — Progress Notes (Signed)
PHYSICAL THERAPY - DAILY TREATMENT NOTE    Patient Name: Jessica Stanley        Date: 01/09/2018  DOB: 1960/08/02   yes Patient DOB Verified  Visit #:   2   of   12  Insurance: Payor: BLUE CROSS / Plan: [employer] / Product Type: HMO /      In time: 125 Out time: 223   Total Treatment Time: 58     Medicare/BCBS Time Tracking (below)   Total Timed Codes (min):  53 1:1 Treatment Time:  47     TREATMENT AREA =  Left knee pain [M25.562]    SUBJECTIVE  Pain Level (on 0 to 10 scale):  3  / 10   Medication Changes/New allergies or changes in medical history, any new surgeries or procedures?    no  If yes, update Summary List   Subjective Functional Status/Changes:  []   No changes reported     Patient reports the foam roller has been really difficult to do and was initial causing a lot of pain. Patient states this has improved slightly but her muscles are still very sore.           OBJECTIVE  Modalities Rationale:     decrease inflammation and decrease pain to improve patient's ability to perform transfers.    min []  Estim, type/location:                                      []   att     []   unatt     []   w/US     []   w/ice    []   w/heat    min []   Mechanical Traction: type/lbs                   []   pro   []   sup   []   int   []   cont    []   before manual    []   after manual    min []   Ultrasound, settings/location:      min []   Iontophoresis w/ dexamethasone, location:                                               []   take home patch       []   in clinic   5 min [x]   Ice     []   Heat    location/position: To L knee in supine with bolster    min []   Vasopneumatic Device, press/temp:     min []   Other:    [x]  Skin assessment post-treatment (if applicable):    [x]   intact    []   redness- no adverse reaction     [] redness - adverse reaction:        38 min Therapeutic Exercise:  [x]   See flow sheet   Rationale:      increase ROM, increase strength, improve coordination and improve balance to improve the patient's ability  to perform recreational activities.      15 min Manual Therapy: STM to L VL, L RF, L quadriceps tendon   Rationale:      decrease pain, increase ROM, increase tissue extensibility and decrease trigger points to improve patient's ability  to perform walking activities.     Billed With/As:   [x]  TE   []  TA   []  Neuro   []  Self Care Patient Education: [x]  Review HEP    []  Progressed/Changed HEP based on:   []  positioning   []  body mechanics   []  transfers   []  heat/ice application    []  other:      Other Objective/Functional Measures:    1:1 TE 51'  Progressed therapy program per flowsheet in order to improve B LE strength and stability  Patient demonstrating difficulty maintaining proper knee alignment during most TE this session with tendency to demonstrate a valgus collapse  Cued on adjustment LE alignment during prone quad stretch in order to reduce stresses to medial knee joint     Post Treatment Pain Level (on 0 to 10) scale:   3  / 10     ASSESSMENT  Assessment/Changes in Function:     Patient denied increased symptoms post session. Educated patient on likely increase in soreness related to today's program and how to respond appropriately. Plan to add to HEP at [place] if no major symptoms arise.      []   See Progress Note/Recertification   Patient will continue to benefit from skilled PT services to modify and progress therapeutic interventions, address functional mobility deficits, address ROM deficits, address strength deficits, analyze and address soft tissue restrictions, analyze and cue movement patterns and analyze and modify body mechanics/ergonomics to attain remaining goals.   Progress toward goals / Updated goals:    Progressing with independence with HEP     PLAN  [x]   Upgrade activities as tolerated yes Continue plan of care   []   Discharge due to :    []   Other:      Therapist: Jackey Loge, PT    Date: 01/09/2018 Time: 2:46 PM     Future Appointments   Date Time Provider Department Center    01/12/2018  3:30 PM Jackey Loge, PT [facility]   01/16/2018  1:30 PM Jackey Loge, PT [facility]   01/19/2018  3:00 PM Jackey Loge, PT [facility]   01/23/2018  1:30 PM Jackey Loge, PT [facility]   01/26/2018  3:00 PM Jackey Loge, PT [facility]   01/30/2018  1:30 PM Jackey Loge, PT [facility]   02/06/2018  1:30 PM Jackey Loge, PT [facility]   02/09/2018  3:00 PM Jackey Loge, PT [facility]

## 2018-01-09 NOTE — Progress Notes (Signed)
PHYSICAL THERAPY - DAILY TREATMENT NOTE    Patient Name: Jessica Stanley        Date: 01/09/2018  DOB: 1960-07-07   yes Patient DOB Verified  Visit #:   2   of   12  Insurance: Payor: BLUE CROSS / Plan: [employer] / Product Type: HMO /      In time: 125 Out time: 223   Total Treatment Time: 58     Medicare/BCBS Time Tracking (below)   Total Timed Codes (min):  53 1:1 Treatment Time:  47     TREATMENT AREA =  Left knee pain [M25.562]    SUBJECTIVE  Pain Level (on 0 to 10 scale):  3  / 10   Medication Changes/New allergies or changes in medical history, any new surgeries or procedures?    no  If yes, update Summary List   Subjective Functional Status/Changes:  []   No changes reported     Patient reports the foam roller has been really difficult to do and was initial causing a lot of pain. Patient states this has improved slightly but her muscles are still very sore.           OBJECTIVE  Modalities Rationale:     decrease inflammation and decrease pain to improve patient's ability to perform transfers.    min []  Estim, type/location:                                      []   att     []   unatt     []   w/US     []   w/ice    []   w/heat    min []   Mechanical Traction: type/lbs                   []   pro   []   sup   []   int   []   cont    []   before manual    []   after manual    min []   Ultrasound, settings/location:      min []   Iontophoresis w/ dexamethasone, location:                                               []   take home patch       []   in clinic   5 min [x]   Ice     []   Heat    location/position: To L knee in supine with bolster    min []   Vasopneumatic Device, press/temp:     min []   Other:    [x]  Skin assessment post-treatment (if applicable):    [x]   intact    []   redness- no adverse reaction     [] redness ??? adverse reaction:        38 min Therapeutic Exercise:  [x]   See flow sheet   Rationale:      increase ROM, increase strength, improve coordination and  improve balance to improve the patient???s ability to perform recreational activities.      15 min Manual Therapy: STM to L VL, L RF, L quadriceps tendon   Rationale:      decrease pain, increase ROM, increase tissue extensibility and decrease trigger points to improve patient's ability  to perform walking activities.     Billed With/As:   [x]  TE   []  TA   []  Neuro   []  Self Care Patient Education: [x]  Review HEP    []  Progressed/Changed HEP based on:   []  positioning   []  body mechanics   []  transfers   []  heat/ice application    []  other:      Other Objective/Functional Measures:    1:1 TE 26'  Progressed therapy program per flowsheet in order to improve B LE strength and stability  Patient demonstrating difficulty maintaining proper knee alignment during most TE this session with tendency to demonstrate a valgus collapse  Cued on adjustment LE alignment during prone quad stretch in order to reduce stresses to medial knee joint     Post Treatment Pain Level (on 0 to 10) scale:   3  / 10     ASSESSMENT  Assessment/Changes in Function:     Patient denied increased symptoms post session. Educated patient on likely increase in soreness related to today's program and how to respond appropriately. Plan to add to HEP at [facility] if no major symptoms arise.      []   See Progress Note/Recertification   Patient will continue to benefit from skilled PT services to modify and progress therapeutic interventions, address functional mobility deficits, address ROM deficits, address strength deficits, analyze and address soft tissue restrictions, analyze and cue movement patterns and analyze and modify body mechanics/ergonomics to attain remaining goals.   Progress toward goals / Updated goals:    Progressing with independence with HEP     PLAN  [x]   Upgrade activities as tolerated yes Continue plan of care   []   Discharge due to :    []   Other:      Therapist: Jackey Loge, PT    Date: 01/09/2018 Time: 2:46 PM      Future Appointments   Date Time Provider Department Center   01/12/2018  3:30 PM Jackey Loge, PT [facility]   01/16/2018  1:30 PM Jackey Loge, PT [facility]   01/19/2018  3:00 PM Jackey Loge, PT [facility]   01/23/2018  1:30 PM Jackey Loge, PT [facility]   01/26/2018  3:00 PM Jackey Loge, PT [facility]   01/30/2018  1:30 PM Jackey Loge, PT [facility]   02/06/2018  1:30 PM Jackey Loge, PT [facility]   02/09/2018  3:00 PM Jackey Loge, PT [facility]

## 2018-01-12 ENCOUNTER — Inpatient Hospital Stay: Admit: 2018-01-12 | Payer: BLUE CROSS/BLUE SHIELD | Primary: Family Medicine

## 2018-01-12 NOTE — Progress Notes (Signed)
 PHYSICAL THERAPY - DAILY TREATMENT NOTE    Patient Name: Jessica Stanley        Date: 01/12/2018  DOB: 1960/11/18   yes Patient DOB Verified  Visit #:   3   of   12  Insurance: Payor: BLUE CROSS / Plan: [employer] / Product Type: HMO /      In time: 552 Out time: 638   Total Treatment Time: 46     Medicare/BCBS Time Tracking (below)   Total Timed Codes (min):  46 1:1 Treatment Time:  40     TREATMENT AREA =  Left knee pain [M25.562]    SUBJECTIVE  Pain Level (on 0 to 10 scale):  3  / 10   Medication Changes/New allergies or changes in medical history, any new surgeries or procedures?    no  If yes, update Summary List   Subjective Functional Status/Changes:  []   No changes reported     Patient reports feeling sore after her last visit, but felt this was more like a workout soreness then pain.           OBJECTIVE  34 min Therapeutic Exercise:  [x]   See flow sheet   Rationale:      increase ROM and increase strength to improve the patient's ability to perform walking activities.      12 min Manual Therapy: STM to L quadriceps, L HS stretch, L knee flexion PROM   Rationale:      decrease pain, increase ROM, increase tissue extensibility and decrease trigger points to improve patient's ability to perform standing activities.     Billed With/As:   [x]  TE   []  TA   []  Neuro   []  Self Care Patient Education: [x]  Review HEP    []  Progressed/Changed HEP based on:   []  positioning   []  body mechanics   []  transfers   []  heat/ice application    []  other:      Other Objective/Functional Measures:    1:1 TE 21'  Demonstrated improved technique with LE tap down this session, improved eccentric control with less requirement of UE support     Post Treatment Pain Level (on 0 to 10) scale:   3  / 10     ASSESSMENT  Assessment/Changes in Function:     Patient tolerated session well. Instructed patient to continue with current HEP and will progress strengthening program in clinic at [facility].      []   See Progress  Note/Recertification   Patient will continue to benefit from skilled PT services to modify and progress therapeutic interventions, address functional mobility deficits, address ROM deficits, address strength deficits, analyze and address soft tissue restrictions, analyze and cue movement patterns, analyze and modify body mechanics/ergonomics and assess and modify postural abnormalities to attain remaining goals.   Progress toward goals / Updated goals:    Progressing with long term strength/stability     PLAN  [x]   Upgrade activities as tolerated yes Continue plan of care   []   Discharge due to :    []   Other:      Therapist: Beverley JONETTA Cera, PT    Date: 01/12/2018 Time: 6:54 PM     Future Appointments   Date Time Provider Department Center   01/16/2018  1:30 PM Cera Beverley JONETTA, PT [facility]   01/19/2018  3:00 PM Cera Beverley JONETTA, PT [facility]   01/23/2018  1:30 PM Cera Beverley JONETTA, PT [facility]   01/26/2018  3:00 PM Guadelupe Beverley BIRCH, PT [facility]   02/07/2018  2:00 PM Guadelupe Beverley BIRCH, PT [facility]   02/09/2018  3:00 PM Guadelupe Beverley BIRCH, PT [facility]   02/13/2018  2:00 PM Guadelupe Beverley BIRCH, PT [facility]   02/16/2018  3:00 PM Guadelupe Beverley BIRCH, PT [facility]   02/20/2018  2:00 PM Guadelupe Beverley BIRCH, PT [facility]   02/23/2018  2:00 PM Guadelupe Beverley BIRCH, PT [facility]   02/27/2018  1:00 PM Guadelupe Beverley BIRCH, PT [facility]   03/03/2018 10:30 AM Guadelupe Beverley BIRCH, PT [facility]   03/06/2018  2:00 PM Guadelupe Beverley BIRCH, PT [facility]

## 2018-01-12 NOTE — Progress Notes (Signed)
PHYSICAL THERAPY - DAILY TREATMENT NOTE    Patient Name: Jessica Stanley        Date: 01/12/2018  DOB: 07/05/1960   yes Patient DOB Verified  Visit #:   3   of   12  Insurance: Payor: BLUE CROSS / Plan: [employer] / Product Type: HMO /      In time: 552 Out time: 638   Total Treatment Time: 46     Medicare/BCBS Time Tracking (below)   Total Timed Codes (min):  46 1:1 Treatment Time:  40     TREATMENT AREA =  Left knee pain [M25.562]    SUBJECTIVE  Pain Level (on 0 to 10 scale):  3  / 10   Medication Changes/New allergies or changes in medical history, any new surgeries or procedures?    no  If yes, update Summary List   Subjective Functional Status/Changes:  []   No changes reported     Patient reports feeling sore after her last visit, but felt this was more like a workout soreness then pain.           OBJECTIVE  34 min Therapeutic Exercise:  [x]   See flow sheet   Rationale:      increase ROM and increase strength to improve the patient???s ability to perform walking activities.      12 min Manual Therapy: STM to L quadriceps, L HS stretch, L knee flexion PROM   Rationale:      decrease pain, increase ROM, increase tissue extensibility and decrease trigger points to improve patient's ability to perform standing activities.     Billed With/As:   [x]  TE   []  TA   []  Neuro   []  Self Care Patient Education: [x]  Review HEP    []  Progressed/Changed HEP based on:   []  positioning   []  body mechanics   []  transfers   []  heat/ice application    []  other:      Other Objective/Functional Measures:    1:1 TE 64'  Demonstrated improved technique with LE tap down this session, improved eccentric control with less requirement of UE support     Post Treatment Pain Level (on 0 to 10) scale:   3  / 10     ASSESSMENT  Assessment/Changes in Function:     Patient tolerated session well. Instructed patient to continue with current HEP and will progress strengthening program in clinic at [facility].       []   See Progress Note/Recertification   Patient will continue to benefit from skilled PT services to modify and progress therapeutic interventions, address functional mobility deficits, address ROM deficits, address strength deficits, analyze and address soft tissue restrictions, analyze and cue movement patterns, analyze and modify body mechanics/ergonomics and assess and modify postural abnormalities to attain remaining goals.   Progress toward goals / Updated goals:    Progressing with long term strength/stability     PLAN  [x]   Upgrade activities as tolerated yes Continue plan of care   []   Discharge due to :    []   Other:      Therapist: Jackey Loge, PT    Date: 01/12/2018 Time: 6:54 PM     Future Appointments   Date Time Provider Department Center   01/16/2018  1:30 PM Jackey Loge, PT [facility]   01/19/2018  3:00 PM Jackey Loge, PT [facility]   01/23/2018  1:30 PM Jackey Loge, PT [facility]   01/26/2018  3:00 PM Jackey Loge, PT [facility]   02/07/2018  2:00 PM Jackey Loge, PT [facility]   02/09/2018  3:00 PM Jackey Loge, PT [facility]   02/13/2018  2:00 PM Jackey Loge, PT [facility]   02/16/2018  3:00 PM Jackey Loge, PT [facility]   02/20/2018  2:00 PM Jackey Loge, PT [facility]   02/23/2018  2:00 PM Jackey Loge, PT [facility]   02/27/2018  1:00 PM Jackey Loge, PT [facility]   03/03/2018 10:30 AM Jackey Loge, PT [facility]   03/06/2018  2:00 PM Gwenlyn Perking Noreene Filbert, PT [facility]

## 2018-01-16 ENCOUNTER — Inpatient Hospital Stay: Admit: 2018-01-16 | Payer: BLUE CROSS/BLUE SHIELD | Primary: Family Medicine

## 2018-01-16 NOTE — Progress Notes (Signed)
PHYSICAL THERAPY - DAILY TREATMENT NOTE    Patient Name: Jessica Stanley        Date: 01/16/2018  DOB: 1961/01/25   yes Patient DOB Verified  Visit #:   4   of   12  Insurance: Payor: BLUE CROSS / Plan: [employer] / Product Type: HMO /      In time: 130 Out time: 224   Total Treatment Time: 54     Medicare/BCBS Time Tracking (below)   Total Timed Codes (min):  54 1:1 Treatment Time:  40     TREATMENT AREA =  Left knee pain [M25.562]    SUBJECTIVE  Pain Level (on 0 to 10 scale):  0  / 10   Medication Changes/New allergies or changes in medical history, any new surgeries or procedures?    no  If yes, update Summary List   Subjective Functional Status/Changes:  []   No changes reported     Patient reports she had a good weekend for the most part and feels like doing the foam roller at home is getting easier.           OBJECTIVE    39 min Therapeutic Exercise:  [x]   See flow sheet   Rationale:      increase ROM, increase strength, improve coordination and improve balance to improve the patient's ability to perform recreational activities.      15 min Manual Therapy: STM to L VMO, L VL; L prone quad strecth   Rationale:      decrease pain, increase ROM, increase tissue extensibility and decrease trigger points to improve patient's ability to perform standing activities.     Billed With/As:   [x]  TE   []  TA   []  Neuro   []  Self Care Patient Education: [x]  Review HEP    []  Progressed/Changed HEP based on:   []  positioning   []  body mechanics   []  transfers   []  heat/ice application    []  other:      Other Objective/Functional Measures:  1:1 TE 25'  Added goblet squat with 10# weight, cued on posterior weight shift in order to reduce stresses to anterior knee  Improved technique with lateral tap down, patient more aware of proper technique and improving eccentric control     Post Treatment Pain Level (on 0 to 10) scale:   0  / 10     ASSESSMENT  Assessment/Changes in Function:     Patient tolerated session well.  Patient educated on performing goblet squats at home, but only 2x10 at this time. Patient acknowledged understanding.      []   See Progress Note/Recertification   Patient will continue to benefit from skilled PT services to modify and progress therapeutic interventions, address functional mobility deficits, address ROM deficits, address strength deficits, analyze and address soft tissue restrictions, analyze and cue movement patterns, analyze and modify body mechanics/ergonomics and assess and modify postural abnormalities to attain remaining goals.   Progress toward goals / Updated goals:    Progressing with STG#2     PLAN  [x]   Upgrade activities as tolerated yes Continue plan of care   []   Discharge due to :    []   Other:      Therapist: Jackey Loge, PT    Date: 01/16/2018 Time: 3:04 PM     Future Appointments   Date Time Provider Department Center   01/19/2018  3:00 PM Jackey Loge, PT [facility]   01/23/2018  1:30 PM Jackey Loge, PT [facility]   01/26/2018  3:00 PM Jackey Loge, PT [facility]   02/07/2018  2:00 PM Jackey Loge, PT [facility]   02/09/2018  3:00 PM Jackey Loge, PT [facility]   02/13/2018  2:00 PM Jackey Loge, PT [facility]   02/16/2018  3:00 PM Jackey Loge, PT [facility]   02/20/2018  2:00 PM Jackey Loge, PT [facility]   02/23/2018  2:00 PM Jackey Loge, PT [facility]   02/27/2018  1:00 PM Jackey Loge, PT [facility]   03/03/2018 10:30 AM Jackey Loge, PT [facility]   03/06/2018  2:00 PM Gwenlyn Perking Noreene Filbert, PT [facility]

## 2018-01-16 NOTE — Progress Notes (Signed)
PHYSICAL THERAPY - DAILY TREATMENT NOTE    Patient Name: Jessica Stanley        Date: 01/16/2018  DOB: Aug 06, 1960   yes Patient DOB Verified  Visit #:   4   of   12  Insurance: Payor: BLUE CROSS / Plan: [employer] / Product Type: HMO /      In time: 130 Out time: 224   Total Treatment Time: 54     Medicare/BCBS Time Tracking (below)   Total Timed Codes (min):  54 1:1 Treatment Time:  40     TREATMENT AREA =  Left knee pain [M25.562]    SUBJECTIVE  Pain Level (on 0 to 10 scale):  0  / 10   Medication Changes/New allergies or changes in medical history, any new surgeries or procedures?    no  If yes, update Summary List   Subjective Functional Status/Changes:  []   No changes reported     Patient reports she had a good weekend for the most part and feels like doing the foam roller at home is getting easier.           OBJECTIVE    39 min Therapeutic Exercise:  [x]   See flow sheet   Rationale:      increase ROM, increase strength, improve coordination and improve balance to improve the patient???s ability to perform recreational activities.      15 min Manual Therapy: STM to L VMO, L VL; L prone quad strecth   Rationale:      decrease pain, increase ROM, increase tissue extensibility and decrease trigger points to improve patient's ability to perform standing activities.     Billed With/As:   [x]  TE   []  TA   []  Neuro   []  Self Care Patient Education: [x]  Review HEP    []  Progressed/Changed HEP based on:   []  positioning   []  body mechanics   []  transfers   []  heat/ice application    []  other:      Other Objective/Functional Measures:  1:1 TE 25'  Added goblet squat with 10# weight, cued on posterior weight shift in order to reduce stresses to anterior knee  Improved technique with lateral tap down, patient more aware of proper technique and improving eccentric control     Post Treatment Pain Level (on 0 to 10) scale:   0  / 10     ASSESSMENT  Assessment/Changes in Function:      Patient tolerated session well. Patient educated on performing goblet squats at home, but only 2x10 at this time. Patient acknowledged understanding.      []   See Progress Note/Recertification   Patient will continue to benefit from skilled PT services to modify and progress therapeutic interventions, address functional mobility deficits, address ROM deficits, address strength deficits, analyze and address soft tissue restrictions, analyze and cue movement patterns, analyze and modify body mechanics/ergonomics and assess and modify postural abnormalities to attain remaining goals.   Progress toward goals / Updated goals:    Progressing with STG#2     PLAN  [x]   Upgrade activities as tolerated yes Continue plan of care   []   Discharge due to :    []   Other:      Therapist: Jackey Loge, PT    Date: 01/16/2018 Time: 3:04 PM     Future Appointments   Date Time Provider Department Center   01/19/2018  3:00 PM Jackey Loge, PT [facility]   01/23/2018  1:30 PM Jackey Loge, PT [facility]   01/26/2018  3:00 PM Jackey Loge, PT [facility]   02/07/2018  2:00 PM Jackey Loge, PT [facility]   02/09/2018  3:00 PM Jackey Loge, PT [facility]   02/13/2018  2:00 PM Jackey Loge, PT [facility]   02/16/2018  3:00 PM Jackey Loge, PT [facility]   02/20/2018  2:00 PM Jackey Loge, PT [facility]   02/23/2018  2:00 PM Jackey Loge, PT [facility]   02/27/2018  1:00 PM Jackey Loge, PT [facility]   03/03/2018 10:30 AM Jackey Loge, PT [facility]   03/06/2018  2:00 PM Gwenlyn Perking Noreene Filbert, PT [facility]

## 2018-01-19 ENCOUNTER — Inpatient Hospital Stay: Admit: 2018-01-19 | Payer: BLUE CROSS/BLUE SHIELD | Primary: Family Medicine

## 2018-01-19 NOTE — Progress Notes (Signed)
 PHYSICAL THERAPY - DAILY TREATMENT NOTE    Patient Name: Jessica Stanley        Date: 01/19/2018  DOB: 1960-03-19   yes Patient DOB Verified  Visit #:   5   of   12  Insurance: Payor: BLUE CROSS / Plan: [employer] / Product Type: HMO /      In time: 300 Out time: 347   Total Treatment Time: 47     Medicare/BCBS Time Tracking (below)   Total Timed Codes (min):  47 1:1 Treatment Time:  41     TREATMENT AREA =  Left knee pain [M25.562]    SUBJECTIVE  Pain Level (on 0 to 10 scale):  0  / 10   Medication Changes/New allergies or changes in medical history, any new surgeries or procedures?    no  If yes, update Summary List   Subjective Functional Status/Changes:  []   No changes reported     Patient reports her quadriceps feels achy and she continues to have a nagging pain on the inside of her knee.           OBJECTIVE    32 min Therapeutic Exercise:  [x]   See flow sheet   Rationale:      increase ROM and increase strength to improve the patient's ability to perform walking activities.      15 min Manual Therapy: STM to L VMO, L tibial mobs;L knee flexion PROM, L knee ext stretch   Rationale:      decrease pain, increase ROM, increase tissue extensibility and decrease trigger points to improve patient's ability to perform recreational activities.     Billed With/As:   [x]  TE   []  TA   []  Neuro   []  Self Care Patient Education: [x]  Review HEP    []  Progressed/Changed HEP based on:   []  positioning   []  body mechanics   []  transfers   []  heat/ice application    []  other:      Other Objective/Functional Measures:    Added rockerboard with ball toss this session in order to improve L knee co-contraction and dynamic stability  Improving eccentric control and stability noted with lateral tap down exercise this session     Post Treatment Pain Level (on 0 to 10) scale:   0  / 10     ASSESSMENT  Assessment/Changes in Function:     Patient denied increased pain post session. Patient educated on rationale behind improving L  knee strength and how this will effect joint forces and therefore pain levels. Patient acknowledged understanding.      []   See Progress Note/Recertification   Patient will continue to benefit from skilled PT services to modify and progress therapeutic interventions, address functional mobility deficits, address ROM deficits, address strength deficits, analyze and address soft tissue restrictions, analyze and cue movement patterns, analyze and modify body mechanics/ergonomics and assess and modify postural abnormalities to attain remaining goals.   Progress toward goals / Updated goals:    Progressing toward LTG#2 per performance with lateral tap downs     PLAN  [x]   Upgrade activities as tolerated yes Continue plan of care   []   Discharge due to :    []   Other:      Therapist: Beverley JONETTA Cera, PT    Date: 01/19/2018 Time: 5:12 PM     Future Appointments   Date Time Provider Department Center   01/23/2018  3:00 PM Cera Beverley JONETTA, PT [facility]   01/26/2018  3:00 PM Guadelupe Beverley BIRCH, PT [facility]   02/07/2018  2:00 PM Guadelupe Beverley BIRCH, PT [facility]   02/09/2018  3:00 PM Guadelupe Beverley BIRCH, PT [facility]   02/13/2018  2:00 PM Guadelupe Beverley BIRCH, PT [facility]   02/16/2018  3:00 PM Guadelupe Beverley BIRCH, PT [facility]   02/20/2018  2:00 PM Guadelupe Beverley BIRCH, PT [facility]   02/23/2018  2:00 PM Guadelupe Beverley BIRCH, PT [facility]   02/27/2018  1:00 PM Guadelupe Beverley BIRCH, PT [facility]   03/03/2018 10:30 AM Guadelupe Beverley BIRCH, PT [facility]   03/06/2018  2:00 PM Guadelupe Beverley BIRCH, PT [facility]

## 2018-01-19 NOTE — Progress Notes (Signed)
PHYSICAL THERAPY - DAILY TREATMENT NOTE    Patient Name: Jessica Stanley        Date: 01/19/2018  DOB: [birth year]   yes Patient DOB Verified  Visit #:   5   of   12  Insurance: Payor: BLUE CROSS / Plan: [employer] / Product Type: HMO /      In time: 300 Out time: 347   Total Treatment Time: 47     Medicare/BCBS Time Tracking (below)   Total Timed Codes (min):  47 1:1 Treatment Time:  41     TREATMENT AREA =  Left knee pain [M25.562]    SUBJECTIVE  Pain Level (on 0 to 10 scale):  0  / 10   Medication Changes/New allergies or changes in medical history, any new surgeries or procedures?    no  If yes, update Summary List   Subjective Functional Status/Changes:  []   No changes reported     Patient reports her quadriceps feels achy and she continues to have a nagging pain on the inside of her knee.           OBJECTIVE    32 min Therapeutic Exercise:  [x]   See flow sheet   Rationale:      increase ROM and increase strength to improve the patient???s ability to perform walking activities.      15 min Manual Therapy: STM to L VMO, L tibial mobs;L knee flexion PROM, L knee ext stretch   Rationale:      decrease pain, increase ROM, increase tissue extensibility and decrease trigger points to improve patient's ability to perform recreational activities.     Billed With/As:   [x]  TE   []  TA   []  Neuro   []  Self Care Patient Education: [x]  Review HEP    []  Progressed/Changed HEP based on:   []  positioning   []  body mechanics   []  transfers   []  heat/ice application    []  other:      Other Objective/Functional Measures:    Added rockerboard with ball toss this session in order to improve L knee co-contraction and dynamic stability  Improving eccentric control and stability noted with lateral tap down exercise this session     Post Treatment Pain Level (on 0 to 10) scale:   0  / 10     ASSESSMENT  Assessment/Changes in Function:     Patient denied increased pain post session. Patient educated on rationale  behind improving L knee strength and how this will effect joint forces and therefore pain levels. Patient acknowledged understanding.      []   See Progress Note/Recertification   Patient will continue to benefit from skilled PT services to modify and progress therapeutic interventions, address functional mobility deficits, address ROM deficits, address strength deficits, analyze and address soft tissue restrictions, analyze and cue movement patterns, analyze and modify body mechanics/ergonomics and assess and modify postural abnormalities to attain remaining goals.   Progress toward goals / Updated goals:    Progressing toward LTG#2 per performance with lateral tap downs     PLAN  [x]   Upgrade activities as tolerated yes Continue plan of care   []   Discharge due to :    []   Other:      Therapist: Jackey LogeAaron D Kaelynn Igo, PT    Date: 01/19/2018 Time: 5:12 PM     Future Appointments   Date Time Provider Department Center   01/23/2018  3:00 PM Jackey LogeBartolomei, Ricardo Schubach D, PT [facility]   01/26/2018  3:00 PM Jackey LogeBartolomei, Murrell Dome D, PT [facility]   02/07/2018  2:00 PM Jackey LogeBartolomei, Honora Searson D, PT [facility]   02/09/2018  3:00 PM Jackey LogeBartolomei, Rayetta Veith D, PT [facility]   02/13/2018  2:00 PM Jackey LogeBartolomei, Ginni Eichler D, PT [facility]   02/16/2018  3:00 PM Jackey LogeBartolomei, Shalaunda Weatherholtz D, PT [facility]   02/20/2018  2:00 PM Jackey LogeBartolomei, Richie Vadala D, PT [facility]   02/23/2018  2:00 PM Jackey LogeBartolomei, Anup Brigham D, PT [facility]   02/27/2018  1:00 PM Jackey LogeBartolomei, Ayiden Milliman D, PT [facility]   03/03/2018 10:30 AM Jackey LogeBartolomei, Clea Dubach D, PT [facility]   03/06/2018  2:00 PM Gwenlyn PerkingBartolomei, Noreene FilbertAaron D, PT [facility]

## 2018-01-23 ENCOUNTER — Inpatient Hospital Stay: Admit: 2018-01-23 | Payer: BLUE CROSS/BLUE SHIELD | Primary: Family Medicine

## 2018-01-23 NOTE — Progress Notes (Signed)
 PHYSICAL THERAPY - DAILY TREATMENT NOTE    Patient Name: Jessica Stanley        Date: 01/23/2018  DOB: [birth year]   yes Patient DOB Verified  Visit #:   6   of   12  Insurance: Payor: BLUE CROSS / Plan: [employer] / Product Type: HMO /      In time: 256 Out time: 350   Total Treatment Time: 54     Medicare/BCBS Time Tracking (below)   Total Timed Codes (min):  54 1:1 Treatment Time:  30     TREATMENT AREA =  Left knee pain [M25.562]    SUBJECTIVE  Pain Level (on 0 to 10 scale):  0  / 10   Medication Changes/New allergies or changes in medical history, any new surgeries or procedures?    no  If yes, update Summary List   Subjective Functional Status/Changes:  []   No changes reported     Patient reports she had a few really good days over the weekend, but yesterday began to notice pain just below her knee cap. Patient feels frustrated that she isn't pain free this far after surgery.           OBJECTIVE    42 min Therapeutic Exercise:  [x]   See flow sheet   Rationale:      increase ROM, increase strength, improve coordination, improve balance and increase proprioception to improve the patient's ability to perform recreational activities.      12 min Manual Therapy: STM to L VMO, L quadriceps tendon, L HS stretch; L tibial mobs   Rationale:      decrease pain, increase ROM, increase tissue extensibility and decrease trigger points to improve patient's ability to perform walking activities.     Billed With/As:   [x]  TE   []  TA   []  Neuro   []  Self Care Patient Education: [x]  Review HEP    []  Progressed/Changed HEP based on:   []  positioning   []  body mechanics   []  transfers   []  heat/ice application    []  other:      Other Objective/Functional Measures:    1:1 TE 71'  Increased mini band exercises to 2x10, patient noting medial knee pain on the L when advancing the R LE forward during monster walks  Improved technique noted with lateral tap down exercise, improved eccentric control     Post Treatment Pain Level  (on 0 to 10) scale:   2  / 10     ASSESSMENT  Assessment/Changes in Function:     Patient noted soreness to retro patellar region post session. Educated patient a follow up with the MD would be appropriate at this time in order to assess current response to exercise as well as provide additional conservative treatments.      []   See Progress Note/Recertification   Patient will continue to benefit from skilled PT services to modify and progress therapeutic interventions, address functional mobility deficits, address ROM deficits, address strength deficits, analyze and address soft tissue restrictions, analyze and cue movement patterns, analyze and modify body mechanics/ergonomics and assess and modify postural abnormalities to attain remaining goals.   Progress toward goals / Updated goals:    Progressing toward LTG#2     PLAN  [x]   Upgrade activities as tolerated yes Continue plan of care   []   Discharge due to :    []   Other:      Therapist: Beverley JONETTA Cera, PT  Date: 01/23/2018 Time: 4:40 PM     Future Appointments   Date Time Provider Department Center   01/26/2018  3:00 PM Guadelupe Beverley BIRCH, PT [facility]   02/07/2018  2:00 PM Guadelupe Beverley BIRCH, PT [facility]   02/09/2018  3:00 PM Guadelupe Beverley BIRCH, PT [facility]   02/13/2018  2:00 PM Guadelupe Beverley BIRCH, PT [facility]   02/16/2018  3:00 PM Guadelupe Beverley BIRCH, PT [facility]   02/20/2018  2:00 PM Guadelupe Beverley BIRCH, PT [facility]   02/23/2018  2:00 PM Guadelupe Beverley BIRCH, PT [facility]   02/27/2018  1:00 PM Guadelupe Beverley BIRCH, PT [facility]   03/03/2018 10:30 AM Guadelupe Beverley BIRCH, PT [facility]   03/06/2018  2:00 PM Guadelupe Beverley BIRCH, PT [facility]

## 2018-01-23 NOTE — Progress Notes (Signed)
PHYSICAL THERAPY - DAILY TREATMENT NOTE    Patient Name: Jessica Stanley        Date: 01/23/2018  DOB: 07/07/1960   yes Patient DOB Verified  Visit #:   6   of   12  Insurance: Payor: BLUE CROSS / Plan: [employer] / Product Type: HMO /      In time: 256 Out time: 350   Total Treatment Time: 54     Medicare/BCBS Time Tracking (below)   Total Timed Codes (min):  54 1:1 Treatment Time:  30     TREATMENT AREA =  Left knee pain [M25.562]    SUBJECTIVE  Pain Level (on 0 to 10 scale):  0  / 10   Medication Changes/New allergies or changes in medical history, any new surgeries or procedures?    no  If yes, update Summary List   Subjective Functional Status/Changes:  []   No changes reported     Patient reports she had a few really good days over the weekend, but yesterday began to notice pain just below her knee cap. Patient feels frustrated that she isn't pain free this far after surgery.           OBJECTIVE    42 min Therapeutic Exercise:  [x]   See flow sheet   Rationale:      increase ROM, increase strength, improve coordination, improve balance and increase proprioception to improve the patient???s ability to perform recreational activities.      12 min Manual Therapy: STM to L VMO, L quadriceps tendon, L HS stretch; L tibial mobs   Rationale:      decrease pain, increase ROM, increase tissue extensibility and decrease trigger points to improve patient's ability to perform walking activities.     Billed With/As:   [x]  TE   []  TA   []  Neuro   []  Self Care Patient Education: [x]  Review HEP    []  Progressed/Changed HEP based on:   []  positioning   []  body mechanics   []  transfers   []  heat/ice application    []  other:      Other Objective/Functional Measures:    1:1 TE 3418'  Increased mini band exercises to 2x10, patient noting medial knee pain on the L when advancing the R LE forward during monster walks  Improved technique noted with lateral tap down exercise, improved eccentric control      Post Treatment Pain Level (on 0 to 10) scale:   2  / 10     ASSESSMENT  Assessment/Changes in Function:     Patient noted soreness to retro patellar region post session. Educated patient a follow up with the MD would be appropriate at this time in order to assess current response to exercise as well as provide additional conservative treatments.      []   See Progress Note/Recertification   Patient will continue to benefit from skilled PT services to modify and progress therapeutic interventions, address functional mobility deficits, address ROM deficits, address strength deficits, analyze and address soft tissue restrictions, analyze and cue movement patterns, analyze and modify body mechanics/ergonomics and assess and modify postural abnormalities to attain remaining goals.   Progress toward goals / Updated goals:    Progressing toward LTG#2     PLAN  [x]   Upgrade activities as tolerated yes Continue plan of care   []   Discharge due to :    []   Other:      Therapist: Jackey LogeAaron D Magdala Brahmbhatt, PT  Date: 01/23/2018 Time: 4:40 PM     Future Appointments   Date Time Provider Department Center   01/26/2018  3:00 PM Jackey LogeBartolomei, Paylin Hailu D, PT [facility]   02/07/2018  2:00 PM Jackey LogeBartolomei, Erek Kowal D, PT [facility]   02/09/2018  3:00 PM Jackey LogeBartolomei, Griffin Dewilde D, PT [facility]   02/13/2018  2:00 PM Jackey LogeBartolomei, Diesha Rostad D, PT [facility]   02/16/2018  3:00 PM Jackey LogeBartolomei, Kenitha Glendinning D, PT [facility]   02/20/2018  2:00 PM Jackey LogeBartolomei, Jerami Tammen D, PT [facility]   02/23/2018  2:00 PM Jackey LogeBartolomei, Beena Catano D, PT [facility]   02/27/2018  1:00 PM Jackey LogeBartolomei, Auther Lyerly D, PT [facility]   03/03/2018 10:30 AM Jackey LogeBartolomei, Shadia Larose D, PT [facility]   03/06/2018  2:00 PM Gwenlyn PerkingBartolomei, Noreene FilbertAaron D, PT [facility]

## 2018-01-26 ENCOUNTER — Encounter: Payer: BLUE CROSS/BLUE SHIELD | Primary: Family Medicine

## 2018-01-30 ENCOUNTER — Encounter: Payer: BLUE CROSS/BLUE SHIELD | Primary: Family Medicine

## 2018-02-06 ENCOUNTER — Encounter: Payer: BLUE CROSS/BLUE SHIELD | Primary: Family Medicine

## 2018-02-07 ENCOUNTER — Inpatient Hospital Stay: Admit: 2018-02-07 | Payer: BLUE CROSS/BLUE SHIELD | Primary: Family Medicine

## 2018-02-07 DIAGNOSIS — M25562 Pain in left knee: Secondary | ICD-10-CM

## 2018-02-07 NOTE — Progress Notes (Signed)
 PHYSICAL THERAPY - DAILY TREATMENT NOTE    Patient Name: Jessica Stanley        Date: 02/07/2018  DOB: 11/18/1960   yes Patient DOB Verified  Visit #:   7   of   12(+6)  Insurance: Payor: BLUE CROSS / Plan: [employer] / Product Type: HMO /      In time: 158 Out time: 243   Total Treatment Time: 45     Medicare/BCBS Time Tracking (below)   Total Timed Codes (min):  45 1:1 Treatment Time:  40     TREATMENT AREA =  Left knee pain [M25.562]    SUBJECTIVE  Pain Level (on 0 to 10 scale):  0  / 10   Medication Changes/New allergies or changes in medical history, any new surgeries or procedures?    no  If yes, update Summary List   Subjective Functional Status/Changes:  []   No changes reported     Patient reports a good improvement in her symptoms since the start of therapy. Patient states the pain in her knee has reduced and reports her pain at worst over the past 2 weeks is 3/10. Patient has been experiencing L hip pain recently which has been more limiting than her knee.           OBJECTIVE    30 min Therapeutic Exercise:  [x]   See flow sheet   Rationale:      increase ROM, increase strength, improve coordination and increase proprioception to improve the patient's ability to perform walking activities.      15 min Manual Therapy: STM to L TFL, L RF, REIL 10 reps with OP   Rationale:      decrease pain, increase ROM, increase tissue extensibility and decrease trigger points to improve patient's ability to perform recreational activities.     Billed With/As:   [x]  TE   []  TA   []  Neuro   []  Self Care Patient Education: [x]  Review HEP    []  Progressed/Changed HEP based on:   []  positioning   []  body mechanics   []  transfers   []  heat/ice application    []  other:      Other Objective/Functional Measures:    1:1 TE 25'  See PN     Post Treatment Pain Level (on 0 to 10) scale:   0  / 10     ASSESSMENT  Assessment/Changes in Function:     See PN     []   See Progress Note/Recertification   Patient will continue to  benefit from skilled PT services to modify and progress therapeutic interventions, address functional mobility deficits, address ROM deficits, address strength deficits, analyze and address soft tissue restrictions, analyze and cue movement patterns, analyze and modify body mechanics/ergonomics and assess and modify postural abnormalities to attain remaining goals.   Progress toward goals / Updated goals:    See PN     PLAN  [x]   Upgrade activities as tolerated yes Continue plan of care   []   Discharge due to :    []   Other:      Therapist: Beverley JONETTA Cera, PT    Date: 02/07/2018 Time: 3:13 PM     Future Appointments   Date Time Provider Department Center   02/13/2018  2:00 PM Cera Beverley JONETTA, PT [facility]   02/16/2018  3:00 PM Cera Beverley JONETTA, PT [facility]   02/20/2018  2:00 PM Cera Beverley JONETTA, PT [facility]   02/23/2018  2:00 PM Guadelupe Beverley JONETTA [facility]   02/27/2018  1:00 PM Guadelupe Beverley JONETTA, PT [facility]   03/03/2018 10:30 AM Guadelupe Beverley JONETTA, PT [facility]   03/06/2018  2:00 PM Guadelupe Beverley JONETTA, PT [facility]

## 2018-02-07 NOTE — Progress Notes (Signed)
[facility] - [facility]  [address], [address],[place], [place] [address] - Phone: [phone]  Fax: [phone]  PROGRESS NOTE  Patient Name: Jessica Stanley DOB: 08-10-1960   Treatment/Medical Diagnosis: Left knee pain [M25.562]   Referral Source: Wynonia Sours, MD     Date of Initial Visit: 12/30/17 Attended Visits: 7 Missed Visits: 0     SUMMARY OF TREATMENT  Patient has attended 6 follow up visits since her initial evaluation on 12/30/17. Patient has received therapeutic exercise, manual therapy and modalities in order to improve L knee ROM, mobility, flexibility, strength, stability and pain reduction.   CURRENT STATUS  Patient has progressed well with her PT program in regards to L knee pain and limited function. Patient states she has been able to kneel and sit with crossed legs over the past 2 weeks without increased pain. Patient rates her pain at worst in her knee over the past 2 weeks as a 3/10. Additionally, patient's L knee AROM has improved to 2-0-145 and demonstrates an ability to perform a lateral tap down from a 4 inch block with proper form indicating improved strength and stability. Recently, patient has reported pain at the top and front of her L hip (L4-L5 distribution) which has limited her more than the L knee. Patient states these symptoms do not occur everyday, however have been occurring 3-4 times over the past several weeks. Patient may be experiencing lumbar radiculopathy type symptoms as well as potential L hip labrum irritation. Patient would benefit from continued PT in order to address these symptoms and help improve her overall function with recreational and exercise activities.     Goal/Measure of Progress Goal Met?   1.  Patient will improve FOTO to >/= 75/100 in order to improve quality of life   Status at last Eval: 66/100 Current Status: 61/100 no   2.  Patient will demonstrate ability to perform 2x10 SL squat to parallel  with proper technique in order to improve tolerance to recreational activities   Status at last Eval: SL squat to table: excess knee valgus angulation, excessive trunk sway Current Status: Performs 2x10 lateral tap down from 4 inch block with good form progressing   3.  Patient will report reduction in pain at worst to 2-3/10 in order to improve tolerance to walking activities   Status at last Eval: 10/10 Current Status: 3/10 progressing     New Goals to be achieved in __4__  weeks:  1. Continue with LTG#1  2. Continue with LTG#2  3. Continue with LTG#3  RECOMMENDATIONS  Patient would benefit from 2x/week for 4 weeks in order to address remaining impairments and functional limitations.   If you have any questions/comments please contact us directly at [phone].   Thank you for allowing [place] to assist in the care of your patient.    Therapist Signature: Jackey Loge, PT Date: 02/07/2018     Time: 3:00 PM   NOTE TO PHYSICIAN:  PLEASE COMPLETE THE ORDERS BELOW AND FAX TO   [facility]: [phone]  If you are unable to process this request in 24 hours please contact our office: [phone]    ___ I have read the above report and request that my patient continue as recommended.   ___ I have read the above report and request that my patient continue therapy with the following changes/special instructions:_________________________________________________________   ___ I have read the above report and request  that my patient be discharged from therapy.     Physician Signature:        Date:       Time:

## 2018-02-07 NOTE — Progress Notes (Signed)
PHYSICAL THERAPY - DAILY TREATMENT NOTE    Patient Name: Jessica Stanley        Date: 02/07/2018  DOB: 1961/03/06   yes Patient DOB Verified  Visit #:   7   of   12(+6)  Insurance: Payor: BLUE CROSS / Plan: [employer] / Product Type: HMO /      In time: 158 Out time: 243   Total Treatment Time: 45     Medicare/BCBS Time Tracking (below)   Total Timed Codes (min):  45 1:1 Treatment Time:  40     TREATMENT AREA =  Left knee pain [M25.562]    SUBJECTIVE  Pain Level (on 0 to 10 scale):  0  / 10   Medication Changes/New allergies or changes in medical history, any new surgeries or procedures?    no  If yes, update Summary List   Subjective Functional Status/Changes:  []   No changes reported     Patient reports a good improvement in her symptoms since the start of therapy. Patient states the pain in her knee has reduced and reports her pain at worst over the past 2 weeks is 3/10. Patient has been experiencing L hip pain recently which has been more limiting than her knee.           OBJECTIVE    30 min Therapeutic Exercise:  [x]   See flow sheet   Rationale:      increase ROM, increase strength, improve coordination and increase proprioception to improve the patient???s ability to perform walking activities.      15 min Manual Therapy: STM to L TFL, L RF, REIL 10 reps with OP   Rationale:      decrease pain, increase ROM, increase tissue extensibility and decrease trigger points to improve patient's ability to perform recreational activities.     Billed With/As:   [x]  TE   []  TA   []  Neuro   []  Self Care Patient Education: [x]  Review HEP    []  Progressed/Changed HEP based on:   []  positioning   []  body mechanics   []  transfers   []  heat/ice application    []  other:      Other Objective/Functional Measures:    1:1 TE 25'  See PN     Post Treatment Pain Level (on 0 to 10) scale:   0  / 10     ASSESSMENT  Assessment/Changes in Function:     See PN     []   See Progress Note/Recertification    Patient will continue to benefit from skilled PT services to modify and progress therapeutic interventions, address functional mobility deficits, address ROM deficits, address strength deficits, analyze and address soft tissue restrictions, analyze and cue movement patterns, analyze and modify body mechanics/ergonomics and assess and modify postural abnormalities to attain remaining goals.   Progress toward goals / Updated goals:    See PN     PLAN  [x]   Upgrade activities as tolerated yes Continue plan of care   []   Discharge due to :    []   Other:      Therapist: Jackey Loge, PT    Date: 02/07/2018 Time: 3:13 PM     Future Appointments   Date Time Provider Department Center   02/13/2018  2:00 PM Jackey Loge, PT [facility]   02/16/2018  3:00 PM Jackey Loge, PT [facility]   02/20/2018  2:00 PM Jackey Loge, PT [facility]   02/23/2018  2:00 PM Laurena BeringBartolomei, Niyla Marone D, PT [facility]   02/27/2018  1:00 PM Jackey LogeBartolomei, Jasalyn Frysinger D, PT [facility]   03/03/2018 10:30 AM Jackey LogeBartolomei, Shebra Muldrow D, PT [facility]   03/06/2018  2:00 PM Gwenlyn PerkingBartolomei, Noreene FilbertAaron D, PT [facility]

## 2018-02-07 NOTE — Progress Notes (Signed)
[facility] ??? [facility]  [address],[place] [address] - Phone: [phone]  Fax: [phone]  PROGRESS NOTE  Patient Name: Jessica Stanley DOB: 01/13/1961   Treatment/Medical Diagnosis: Left knee pain [M25.562]   Referral Source: O'Connell, Patrick, MD     Date of Initial Visit: 12/30/17 Attended Visits: 7 Missed Visits: 0     SUMMARY OF TREATMENT  Patient has attended 6 follow up visits since her initial evaluation on 12/30/17. Patient has received therapeutic exercise, manual therapy and modalities in order to improve L knee ROM, mobility, flexibility, strength, stability and pain reduction.   CURRENT STATUS  Patient has progressed well with her PT program in regards to L knee pain and limited function. Patient states she has been able to kneel and sit with crossed legs over the past 2 weeks without increased pain. Patient rates her pain at worst in her knee over the past 2 weeks as a 3/10. Additionally, patient's L knee AROM has improved to 2-0-145 and demonstrates an ability to perform a lateral tap down from a 4 inch block with proper form indicating improved strength and stability. Recently, patient has reported pain at the top and front of her L hip (L4-L5 distribution) which has limited her more than the L knee. Patient states these symptoms do not occur everyday, however have been occurring 3-4 times over the past several weeks. Patient may be experiencing lumbar radiculopathy type symptoms as well as potential L hip labrum irritation. Patient would benefit from continued PT in order to address these symptoms and help improve her overall function with recreational and exercise activities.     Goal/Measure of Progress Goal Met?   1.  Patient will improve FOTO to >/= 75/100 in order to improve quality of life   Status at last Eval: 66/100 Current Status: 61/100 no   2.  Patient will demonstrate ability to perform 2x10 SL squat to parallel  with proper technique in order to improve tolerance to recreational activities   Status at last Eval: SL squat to table: excess knee valgus angulation, excessive trunk sway Current Status: Performs 2x10 lateral tap down from 4 inch block with good form progressing   3.  Patient will report reduction in pain at worst to 2-3/10 in order to improve tolerance to walking activities   Status at last Eval: 10/10 Current Status: 3/10 progressing     New Goals to be achieved in __4__  weeks:  1. Continue with LTG#1  2. Continue with LTG#2  3. Continue with LTG#3  RECOMMENDATIONS  Patient would benefit from 2x/week for 4 weeks in order to address remaining impairments and functional limitations.   If you have any questions/comments please contact us directly at [phone].   Thank you for allowing us to assist in the care of your patient.    Therapist Signature: Eulla Kochanowski D Bowe Sidor, PT Date: 02/07/2018     Time: 3:00 PM   NOTE TO PHYSICIAN:  PLEASE COMPLETE THE ORDERS BELOW AND FAX TO   [facility]: [phone]  If you are unable to process this request in 24 hours please contact our office: [phone]    ___ I have read the above report and request that my patient continue as recommended.   ___ I have read the above report and request that my patient continue therapy with the following changes/special instructions:_________________________________________________________   ___ I have read the above report and request   that my patient be discharged from therapy.     Physician Signature:        Date:       Time:

## 2018-02-09 ENCOUNTER — Encounter: Payer: BLUE CROSS/BLUE SHIELD | Primary: Family Medicine

## 2018-02-13 ENCOUNTER — Inpatient Hospital Stay: Admit: 2018-02-13 | Payer: BLUE CROSS/BLUE SHIELD | Primary: Family Medicine

## 2018-02-13 NOTE — Progress Notes (Signed)
PHYSICAL THERAPY - DAILY TREATMENT NOTE    Patient Name: Jessica Stanley        Date: 02/13/2018  DOB: [birth year]   yes Patient DOB Verified  Visit #:   8   of   18  Insurance: Payor: BLUE CROSS / Plan: [employer] / Product Type: HMO /      In time: 159 Out time: 241   Total Treatment Time: 42     Medicare/BCBS Time Tracking (below)   Total Timed Codes (min):  42 1:1 Treatment Time:  25     TREATMENT AREA =  Left knee pain [M25.562]    SUBJECTIVE  Pain Level (on 0 to 10 scale):  3-4  / 10   Medication Changes/New allergies or changes in medical history, any new surgeries or procedures?    no  If yes, update Summary List   Subjective Functional Status/Changes:  []   No changes reported     Patient reports the new stretches seemed to reduce her pain a little bit, but it is still noticeable in her L thigh. Patient feels like the pain on the inside of her knee is still there, just less intensity.           OBJECTIVE    27 min Therapeutic Exercise:  [x]   See flow sheet   Rationale:      increase ROM and increase strength to improve the patient's ability to perform recreational activities.      15 min Manual Therapy: STM to L VMO, L quadriceps tendon; L MET correction for anterior innominate   Rationale:      decrease pain, increase ROM, increase tissue extensibility and decrease trigger points to improve patient's ability to perform standing activities.       Billed With/As:   [x]  TE   []  TA   []  Neuro   []  Self Care Patient Education: [x]  Review HEP    []  Progressed/Changed HEP based on:   []  positioning   []  body mechanics   []  transfers   []  heat/ice application    []  other:      Other Objective/Functional Measures:    1:1 TE 10'  Good technique noted with lateral tap down exercise, improved dynamic control and stability noted  Tenderness noted to L VMO and wuad tendon during MT     Post Treatment Pain Level (on 0 to 10) scale:   2  / 10     ASSESSMENT  Assessment/Changes in Function:     Patient reported  slight reduction in symptoms post session. Patient educated to speak with MD about possible treatment for the lumbar spine in order to improve current symptoms.      []   See Progress Note/Recertification   Patient will continue to benefit from skilled PT services to modify and progress therapeutic interventions, address functional mobility deficits, address ROM deficits, address strength deficits, analyze and address soft tissue restrictions, analyze and cue movement patterns, analyze and modify body mechanics/ergonomics and assess and modify postural abnormalities to attain remaining goals.   Progress toward goals / Updated goals:    Progressing with LTG#2     PLAN  [x]   Upgrade activities as tolerated yes Continue plan of care   []   Discharge due to :    []   Other:      Therapist: Jackey Loge, PT    Date: 02/13/2018 Time: 3:52 PM     Future Appointments   Date Time Provider Department Center  02/20/2018  2:00 PM Jackey Loge, PT [facility]   02/23/2018  2:00 PM Jackey Loge, PT [facility]   02/27/2018  1:00 PM Jackey Loge, PT [facility]   03/03/2018 10:30 AM Jackey Loge, PT [facility]   03/06/2018  2:00 PM Gwenlyn Perking Noreene Filbert, PT [facility]

## 2018-02-13 NOTE — Progress Notes (Signed)
 [name] - [facility]  [address], [place]  [place], [place] [address] - Phone: [phone]  Fax: [phone]  DISCHARGE SUMMARY  Patient Name: Jessica Stanley DOB: Nov 25, 1960   Treatment/Medical Diagnosis: Left knee pain [M25.562]   Referral Source: Cherilyn Dover, MD     Date of Initial Visit: 12/30/17 Attended Visits: 8 Missed Visits: 0     SUMMARY OF TREATMENT  Patient has attended 7 follow up visits since her initial evaluation on 12/30/17. Patient received therapeutic exercise, manual therapy and modalities in order to improve L knee ROM, mobility, flexibility, strength, stability and pain reduction.   CURRENT STATUS  Patient last attended PT on 02/13/18. Patient was deemed appropriate to continue with PT at her last progress note on 02/07/18 but did not return. At the time, patient will be discharged from PT.     Goal/Measure of Progress Goal Met?   1.  Patient will improve FOTO to >/= 75/100 in order to improve quality of life   Status at last Eval: 66/100 Current Status: 61/100 no   2.  Patient will demonstrate ability to perform 2x10 SL squat to parallel with proper technique in order to improve tolerance to recreational activities   Status at last Eval: SL squat to table: excess knee valgus angulation, excessive trunk sway Current Status: Performs 2x10 lateral tap down from 4 inch block with good form progressing   3.  Patient will report reduction in pain at worst to 2-3/10 in order to improve tolerance to walking activities   Status at last Eval: 10/10 Current Status: 3/10 progressing     RECOMMENDATIONS  Discontinue therapy due to lack of attendance or compliance.    If you have any questions/comments please contact us  directly at [phone].   Thank you for allowing us  to assist in the care of your patient.    Therapist Signature: Beverley JONETTA Cera, PT Date: 06/12/18     Time: 9:28 AM

## 2018-02-13 NOTE — Progress Notes (Signed)
PHYSICAL THERAPY - DAILY TREATMENT NOTE    Patient Name: Jessica Stanley        Date: 02/13/2018  DOB: 1960-03-14   yes Patient DOB Verified  Visit #:   8   of   18  Insurance: Payor: BLUE CROSS / Plan: [employer] / Product Type: HMO /      In time: 159 Out time: 241   Total Treatment Time: 42     Medicare/BCBS Time Tracking (below)   Total Timed Codes (min):  42 1:1 Treatment Time:  25     TREATMENT AREA =  Left knee pain [M25.562]    SUBJECTIVE  Pain Level (on 0 to 10 scale):  3-4  / 10   Medication Changes/New allergies or changes in medical history, any new surgeries or procedures?    no  If yes, update Summary List   Subjective Functional Status/Changes:  []   No changes reported     Patient reports the new stretches seemed to reduce her pain a little bit, but it is still noticeable in her L thigh. Patient feels like the pain on the inside of her knee is still there, just less intensity.           OBJECTIVE    27 min Therapeutic Exercise:  [x]   See flow sheet   Rationale:      increase ROM and increase strength to improve the patient???s ability to perform recreational activities.      15 min Manual Therapy: STM to L VMO, L quadriceps tendon; L MET correction for anterior innominate   Rationale:      decrease pain, increase ROM, increase tissue extensibility and decrease trigger points to improve patient's ability to perform standing activities.       Billed With/As:   [x]  TE   []  TA   []  Neuro   []  Self Care Patient Education: [x]  Review HEP    []  Progressed/Changed HEP based on:   []  positioning   []  body mechanics   []  transfers   []  heat/ice application    []  other:      Other Objective/Functional Measures:    1:1 TE 10'  Good technique noted with lateral tap down exercise, improved dynamic control and stability noted  Tenderness noted to L VMO and wuad tendon during MT     Post Treatment Pain Level (on 0 to 10) scale:   2  / 10     ASSESSMENT  Assessment/Changes in Function:      Patient reported slight reduction in symptoms post session. Patient educated to speak with MD about possible treatment for the lumbar spine in order to improve current symptoms.      []   See Progress Note/Recertification   Patient will continue to benefit from skilled PT services to modify and progress therapeutic interventions, address functional mobility deficits, address ROM deficits, address strength deficits, analyze and address soft tissue restrictions, analyze and cue movement patterns, analyze and modify body mechanics/ergonomics and assess and modify postural abnormalities to attain remaining goals.   Progress toward goals / Updated goals:    Progressing with LTG#2     PLAN  [x]   Upgrade activities as tolerated yes Continue plan of care   []   Discharge due to :    []   Other:      Therapist: Ilda Foil, PT    Date: 02/13/2018 Time: 3:52 PM     Future Appointments   Date Time Provider [place]  02/20/2018  2:00 PM Ilda Foil, PT [facility]   02/23/2018  2:00 PM Ilda Foil, PT [facility]   02/27/2018  1:00 PM Ilda Foil, PT [facility]   03/03/2018 10:30 AM Ilda Foil, PT [facility]   03/06/2018  2:00 PM Ludwig Lean Zenovia Jordan, PT [facility]

## 2018-02-13 NOTE — Progress Notes (Signed)
[place] ??? [address], [place], [place] [address] - Phone: [phone]  Fax: [phone]  DISCHARGE SUMMARY  Patient Name: Jessica Stanley DOB: December 22, 1960   Treatment/Medical Diagnosis: Left knee pain [M25.562]   Referral Source: Tempie Donning, MD     Date of Initial Visit: 12/30/17 Attended Visits: 8 Missed Visits: 0     SUMMARY OF TREATMENT  Patient has attended 7 follow up visits since her initial evaluation on 12/30/17. Patient received therapeutic exercise, manual therapy and modalities in order to improve L knee ROM, mobility, flexibility, strength, stability and pain reduction.   CURRENT STATUS  Patient last attended PT on 02/13/18. Patient was deemed appropriate to continue with PT at her last progress note on 02/07/18 but did not return. At the time, patient will be discharged from PT.   ??  Goal/Measure of Progress Goal Met?   1.  Patient will improve FOTO to >/= 75/100 in order to improve quality of life   Status at last Eval: 66/100 Current Status: 61/100 no   2.  Patient will demonstrate ability to perform 2x10 SL squat to parallel with proper technique in order to improve tolerance to recreational activities   Status at last Eval: SL squat to table: excess knee valgus angulation, excessive trunk sway Current Status: Performs 2x10 lateral tap down from 4 inch block with good form progressing   3.  Patient will report reduction in pain at worst to 2-3/10 in order to improve tolerance to walking activities   Status at last Eval: 10/10 Current Status: 3/10 progressing   ??  RECOMMENDATIONS  Discontinue therapy due to lack of attendance or compliance.    If you have any questions/comments please contact us directly at [phone].   Thank you for allowing [place] to assist in the care of your patient.    Therapist Signature: Ilda Foil, PT Date: 06/12/18     Time: 9:28 AM

## 2018-02-16 ENCOUNTER — Encounter: Payer: BLUE CROSS/BLUE SHIELD | Primary: Family Medicine

## 2018-02-20 ENCOUNTER — Encounter: Payer: BLUE CROSS/BLUE SHIELD | Primary: Family Medicine

## 2018-02-23 ENCOUNTER — Encounter: Payer: BLUE CROSS/BLUE SHIELD | Primary: Family Medicine

## 2018-02-27 ENCOUNTER — Encounter: Payer: BLUE CROSS/BLUE SHIELD | Primary: Family Medicine

## 2018-03-02 ENCOUNTER — Encounter: Payer: BLUE CROSS/BLUE SHIELD | Primary: Family Medicine

## 2018-03-03 ENCOUNTER — Encounter: Payer: BLUE CROSS/BLUE SHIELD | Primary: Family Medicine

## 2018-03-06 ENCOUNTER — Encounter: Payer: BLUE CROSS/BLUE SHIELD | Primary: Family Medicine

## 2018-05-11 ENCOUNTER — Inpatient Hospital Stay: Admit: 2018-05-11 | Payer: BLUE CROSS/BLUE SHIELD | Primary: Family Medicine

## 2018-05-11 DIAGNOSIS — M25552 Pain in left hip: Secondary | ICD-10-CM

## 2018-05-11 NOTE — Progress Notes (Signed)
 PHYSICAL THERAPY - DAILY TREATMENT NOTE    Patient Name: Jessica Stanley        Date: 05/11/2018  DOB: June 24, 1960   yes Patient DOB Verified  Visit #:   1   of   12  Insurance: Payor: BLUE CROSS / Plan: [employer] / Product Type: HMO /      In time: 1155 Out time: 1235   Total Treatment Time: 40     Medicare/BCBS Time Tracking (below)   Total Timed Codes (min):  10 1:1 Treatment Time:  40     TREATMENT AREA =  Pain in left hip [M25.552]    SUBJECTIVE  Pain Level (on 0 to 10 scale):  2  / 10   Medication Changes/New allergies or changes in medical history, any new surgeries or procedures?    no  If yes, update Summary List   Subjective Functional Status/Changes:  []   No changes reported     See POC          OBJECTIVE      10 min Therapeutic Exercise:  [x]   See flow sheet   Rationale:      increase ROM and increase strength to improve the patient's ability to perform walking activities.       Billed With/As:   [x]  TE   []  TA   []  Neuro   []  Self Care Patient Education: [x]  Review HEP    []  Progressed/Changed HEP based on:   []  positioning   []  body mechanics   []  transfers   []  heat/ice application    []  other:      Other Objective/Functional Measures:    See POC     Post Treatment Pain Level (on 0 to 10) scale:   2  / 10     ASSESSMENT  Assessment/Changes in Function:     See POC     []   See Progress Note/Recertification   Patient will continue to benefit from skilled PT services to modify and progress therapeutic interventions, address functional mobility deficits, address ROM deficits, address strength deficits, analyze and address soft tissue restrictions, analyze and cue movement patterns, analyze and modify body mechanics/ergonomics and assess and modify postural abnormalities to attain remaining goals.   Progress toward goals / Updated goals:    See POC     PLAN  [x]   Upgrade activities as tolerated yes Continue plan of care   []   Discharge due to :    []   Other:      Therapist: Beverley JONETTA Cera, PT     Date: 05/11/2018 Time: 12:53 PM     Future Appointments   Date Time Provider Department Center   05/16/2018  5:30 PM Cera Beverley JONETTA, PT [facility]   05/18/2018  5:30 PM Cera Beverley JONETTA, PT [facility]   05/23/2018  2:30 PM Cera Beverley JONETTA, PT [facility]   05/25/2018  2:30 PM Cera Beverley JONETTA, PT [facility]   05/30/2018  3:00 PM Cera Beverley JONETTA, PT [facility]   06/01/2018  3:00 PM Cera Beverley JONETTA, PT [facility]   06/06/2018  2:30 PM Cera Beverley JONETTA, PT [facility]

## 2018-05-11 NOTE — Progress Notes (Signed)
[facility] - [facility] THERAPY    [address],[place], [place] [address]       Phone: [phone]  Fax: [phone]  PLAN OF CARE / STATEMENT OF MEDICAL NECESSITY FOR PHYSICAL THERAPY SERVICES  Patient Name: Jessica Stanley DOB: 1960/03/23   Medical   Diagnosis: L Hip/Lumbar spine pain Treatment Diagnosis: Pain in left hip [M25.552]   Onset Date: Chronic     Referral Source: Jane Canary, [place] Start of Care [facility]): 05/11/2018   Prior Hospitalization: See medical history Provider #: [phone]   Prior Level of Function: Chronic history of difficulty with walking, standing, recreational activities   Comorbidities: L knee partial menisectomy 2019   Medications: Verified on Patient Summary List   The Plan of Care and following information is based on the information from the initial evaluation.   ===========================================================================================  Assessment / key information:  Patient is a 58 year old female presenting to therapy with signs and symptoms consistent with L hip and lumbar spine pain. Patient states her symptoms began several months ago while in PT for her knee. Patient states her pain occurs at the top/bock portion of her hip and wraps around to the front of her thigh. Patient also notes her L leg feels weak on occasion, on one instance making it difficult to transfer in and out of her car. Patient did have an MRI of her lumbar spine which was unremarkable as well as an EMG of her L lateral thigh which was also unremarkable. Patient denies any imaging of her hip at this time. Patient reports increased difficulty with walking, standing, recreational activities. Patient notes symptoms feel better with heat and rest. Patient rates pain at worst 10/10 and 0/10 at best.   Objective Data: Inspection-No significant gait deviations noted. L hip AROM WNL for age without pain during movement. Strength Testing: Hip flex R  5/5, L 4-/5; ext R 4/5, L 4/5; Knee Flex R 5/5, L 4/5; ext R 5/5, L 4/5. Flexibility Testing: minimal restriction to L RF, L IP, L PF. REIL: increased lumbar spine pain, no change in radicular symptoms. Reproduction of symptoms with P-A mobilization at L4-L5. Significant tenderness to L RF, L IP, L lumbar paraspinals and multifidi. FOTO: unavailable at this time.      Patient educated on diagnosis, prognosis, POC and HEP. Patient issued copy of HEP and denied additional questions. Patient will benefit from skilled PT in order to address these impairments and functional limitations.   ===========================================================================================  Eval Complexity: History MEDIUM  Complexity : 1-2 comorbidities / personal factors will impact the outcome/ POC ;  Examination  HIGH Complexity : 4+ Standardized tests and measures addressing body structure, function, activity limitation and / or participation in recreation ; Presentation MEDIUM Complexity : Evolving with changing characteristics ;  Decision Making Other outcome measures Medium based upon objective findings  MEDIUM; Overall Complexity MEDIUM  Problem List: pain affecting function, decrease strength, decrease ADL/ functional abilitiies, decrease activity tolerance and decrease flexibility/ joint mobility   Treatment Plan may include any combination of the following: Therapeutic exercise, Therapeutic activities, Neuromuscular re-education, Physical agent/modality, Gait/balance training, Manual therapy, Patient education and Functional mobility training  Patient / Family readiness to learn indicated by: asking questions, trying to perform skills and interest  Persons(s) to be included in education: patient (P)  Barriers to Learning/Limitations: no  Measures taken:    Patient Goal (s): Reduce pain   Patient self reported health status:  excellent  Rehabilitation Potential: good  . Short Term Goals: To be accomplished in  3   weeks:  1. Patient will demonstrate independence with HEP for self management of symptoms.  2. Patient will report reduction in pain at worst to 4-5/10 in order to improve tolerance to recreational activities.   . Long Term Goals: To be accomplished in  6  weeks:  1. Patient will improve FOTO by >/= 10 points in order to improve quality of life.  2. Patient will report reduction in pain at worst to 2-3/10 in order to improve tolerance to walking activities.  3. Patient will improve L hip strength to 5/5 for all planes in order to return to running activities.   Frequency / Duration:   Patient to be seen  2  times per week for 6  weeks:  Patient / Caregiver education and instruction: self care, activity modification and exercises  G-Codes (GP): na  Therapist Signature: Jackey Loge, PT Date: 05/11/2018   Certification Period: na Time: 12:42 PM   ===========================================================================================  I certify that the above Physical Therapy Services are being furnished while the patient is under my care.  I agree with the treatment plan and certify that this therapy is necessary.    Physician Signature:        Date:       Time:     Please sign and return to [facility] or you may fax the signed copy to [phone].  Thank you.

## 2018-05-11 NOTE — Progress Notes (Signed)
PHYSICAL THERAPY - DAILY TREATMENT NOTE    Patient Name: Jessica Stanley        Date: 05/11/2018  DOB: 08/29/1960   yes Patient DOB Verified  Visit #:   1   of   12  Insurance: Payor: BLUE CROSS / Plan: [employer] / Product Type: HMO /      In time: 1155 Out time: 1235   Total Treatment Time: 40     Medicare/BCBS Time Tracking (below)   Total Timed Codes (min):  10 1:1 Treatment Time:  40     TREATMENT AREA =  Pain in left hip [M25.552]    SUBJECTIVE  Pain Level (on 0 to 10 scale):  2  / 10   Medication Changes/New allergies or changes in medical history, any new surgeries or procedures?    no  If yes, update Summary List   Subjective Functional Status/Changes:  []  No changes reported     See POC          OBJECTIVE      10 min Therapeutic Exercise:  [x]  See flow sheet   Rationale:      increase ROM and increase strength to improve the patient???s ability to perform walking activities.       Billed With/As:   [x] TE   [] TA   [] Neuro   [] Self Care Patient Education: [x] Review HEP    [] Progressed/Changed HEP based on:   [] positioning   [] body mechanics   [] transfers   [] heat/ice application    [] other:      Other Objective/Functional Measures:    See POC     Post Treatment Pain Level (on 0 to 10) scale:   2  / 10     ASSESSMENT  Assessment/Changes in Function:     See POC     []  See Progress Note/Recertification   Patient will continue to benefit from skilled PT services to modify and progress therapeutic interventions, address functional mobility deficits, address ROM deficits, address strength deficits, analyze and address soft tissue restrictions, analyze and cue movement patterns, analyze and modify body mechanics/ergonomics and assess and modify postural abnormalities to attain remaining goals.   Progress toward goals / Updated goals:    See POC     PLAN  [x]  Upgrade activities as tolerated yes Continue plan of care   []  Discharge due to :    []  Other:      Therapist: Darly Fails D Alysandra Lobue, PT     Date: 05/11/2018 Time: 12:53 PM     Future Appointments   Date Time Provider Department Center   05/16/2018  5:30 PM Anahid Eskelson D, PT [facility]   05/18/2018  5:30 PM Fuad Forget D, PT [facility]   05/23/2018  2:30 PM Taffie Eckmann D, PT [facility]   05/25/2018  2:30 PM Kearstyn Avitia D, PT [facility]   05/30/2018  3:00 PM Sekai Nayak D, PT [facility]   06/01/2018  3:00 PM Tanaisha Pittman D, PT [facility]   06/06/2018  2:30 PM Kvion Shapley D, PT [facility]

## 2018-05-11 NOTE — Progress Notes (Signed)
[facility] THERAPY    [address],[place], [place] [address]       Phone: [phone]  Fax: [phone]  PLAN OF CARE / STATEMENT OF MEDICAL NECESSITY FOR PHYSICAL THERAPY SERVICES  Patient Name: Jessica Stanley DOB: 1960/03/31   Medical   Diagnosis: L Hip/Lumbar spine pain Treatment Diagnosis: Pain in left hip [M25.552]   Onset Date: Chronic     Referral Source: Jane Canary, [place] Start of Care [facility]): 05/11/2018   Prior Hospitalization: See medical history Provider #: [id number]   Prior Level of Function: Chronic history of difficulty with walking, standing, recreational activities   Comorbidities: L knee partial menisectomy 2019   Medications: Verified on Patient Summary List   The Plan of Care and following information is based on the information from the initial evaluation.   ===========================================================================================  Assessment / key information:  Patient is a 58 year old female presenting to therapy with signs and symptoms consistent with L hip and lumbar spine pain. Patient states her symptoms began several months ago while in PT for her knee. Patient states her pain occurs at the top/bock portion of her hip and wraps around to the front of her thigh. Patient also notes her L leg feels weak on occasion, on one instance making it difficult to transfer in and out of her car. Patient did have an MRI of her lumbar spine which was unremarkable as well as an EMG of her L lateral thigh which was also unremarkable. Patient denies any imaging of her hip at this time. Patient reports increased difficulty with walking, standing, recreational activities. Patient notes symptoms feel better with heat and rest. Patient rates pain at worst 10/10 and 0/10 at best.   Objective Data: Inspection-No significant gait deviations noted. L hip AROM WNL for age without pain during movement. Strength Testing: Hip flex  R 5/5, L 4-/5; ext R 4/5, L 4/5; Knee Flex R 5/5, L 4/5; ext R 5/5, L 4/5. Flexibility Testing: minimal restriction to L RF, L IP, L PF. REIL: increased lumbar spine pain, no change in radicular symptoms. Reproduction of symptoms with P-A mobilization at L4-L5. Significant tenderness to L RF, L IP, L lumbar paraspinals and multifidi. FOTO: unavailable at this time.      Patient educated on diagnosis, prognosis, POC and HEP. Patient issued copy of HEP and denied additional questions. Patient will benefit from skilled PT in order to address these impairments and functional limitations.   ===========================================================================================  Eval Complexity: History MEDIUM  Complexity : 1-2 comorbidities / personal factors will impact the outcome/ POC ;  Examination  HIGH Complexity : 4+ Standardized tests and measures addressing body structure, function, activity limitation and / or participation in recreation ; Presentation MEDIUM Complexity : Evolving with changing characteristics ;  Decision Making Other outcome measures Medium based upon objective findings  MEDIUM; Overall Complexity MEDIUM  Problem List: pain affecting function, decrease strength, decrease ADL/ functional abilitiies, decrease activity tolerance and decrease flexibility/ joint mobility   Treatment Plan may include any combination of the following: Therapeutic exercise, Therapeutic activities, Neuromuscular re-education, Physical agent/modality, Gait/balance training, Manual therapy, Patient education and Functional mobility training  Patient / Family readiness to learn indicated by: asking questions, trying to perform skills and interest  Persons(s) to be included in education: patient (P)  Barriers to Learning/Limitations: no  Measures taken:    Patient Goal (s): Reduce pain   Patient self reported health status:  excellent  Rehabilitation Potential: good  ? Short Term Goals: To be accomplished in  3  weeks:   1. Patient will demonstrate independence with HEP for self management of symptoms.  2. Patient will report reduction in pain at worst to 4-5/10 in order to improve tolerance to recreational activities.   ? Long Term Goals: To be accomplished in  6  weeks:  1. Patient will improve FOTO by >/= 10 points in order to improve quality of life.  2. Patient will report reduction in pain at worst to 2-3/10 in order to improve tolerance to walking activities.  3. Patient will improve L hip strength to 5/5 for all planes in order to return to running activities.   Frequency / Duration:   Patient to be seen  2  times per week for 6  weeks:  Patient / Caregiver education and instruction: self care, activity modification and exercises  G-Codes (GP): na  Therapist Signature: Jackey Loge, PT Date: 05/11/2018   Certification Period: na Time: 12:42 PM   ===========================================================================================  I certify that the above Physical Therapy Services are being furnished while the patient is under my care.  I agree with the treatment plan and certify that this therapy is necessary.    Physician Signature:        Date:       Time:     Please sign and return to [facility] or you may fax the signed copy to [phone].  Thank you.

## 2018-05-16 ENCOUNTER — Inpatient Hospital Stay: Admit: 2018-05-16 | Payer: BLUE CROSS/BLUE SHIELD | Primary: Family Medicine

## 2018-05-16 NOTE — Progress Notes (Signed)
PHYSICAL THERAPY - DAILY TREATMENT NOTE    Patient Name: Jessica Stanley        Date: 05/16/2018  DOB: 1960-08-16   yes Patient DOB Verified  Visit #:   2   of   12  Insurance: Payor: BLUE CROSS / Plan: [employer] / Product Type: HMO /      In time: 525 Out time: 615   Total Treatment Time: 50     Medicare/BCBS Time Tracking (below)   Total Timed Codes (min):  50 1:1 Treatment Time:  30     TREATMENT AREA =  Pain in left hip [M25.552]    SUBJECTIVE  Pain Level (on 0 to 10 scale):  4  / 10   Medication Changes/New allergies or changes in medical history, any new surgeries or procedures?    no  If yes, update Summary List   Subjective Functional Status/Changes:  []   No changes reported     Patient reports the back bend exercise seems to help, but she continues to experience pain down the front of her thigh on a regular basis.           OBJECTIVE    30 min Therapeutic Exercise:  [x]   See flow sheet   Rationale:      increase ROM and increase strength to improve the patient's ability to perform recreational activities.      20 min Manual Therapy: STM to L lumbar para, L QL, L RF; A.R.T to L RF; sacral mobs   Rationale:      decrease pain, increase ROM, increase tissue extensibility and decrease trigger points to improve patient's ability to perform walking activities.       Billed With/As:   [x]  TE   []  TA   []  Neuro   []  Self Care Patient Education: [x]  Review HEP    []  Progressed/Changed HEP based on:   []  positioning   []  body mechanics   []  transfers   []  heat/ice application    []  other:      Other Objective/Functional Measures:    1:1 TE 10'  Progressed program per flowsheet for improved core stability  Patient demonstrating increased difficulty with stabilization of L pelvis     Post Treatment Pain Level (on 0 to 10) scale:   0  / 10     ASSESSMENT  Assessment/Changes in Function:     Patient noted reduced pain post session. Plan to continue with program designed to improve core and pelvic stability while  reducing nerve irritation in L4-L5 nerve distribution.     []   See Progress Note/Recertification   Patient will continue to benefit from skilled PT services to modify and progress therapeutic interventions, address functional mobility deficits, address ROM deficits, address strength deficits, analyze and address soft tissue restrictions, analyze and cue movement patterns and analyze and modify body mechanics/ergonomics to attain remaining goals.   Progress toward goals / Updated goals:    Progressing with STG#1     PLAN  [x]   Upgrade activities as tolerated yes Continue plan of care   []   Discharge due to :    []   Other:      Therapist: Jackey Loge, PT    Date: 05/16/2018 Time: 6:50 PM     Future Appointments   Date Time Provider Department Center   05/18/2018  5:30 PM Jackey Loge, PT [facility]   05/23/2018  2:30 PM Gwenlyn Perking Noreene Filbert, PT [facility]  05/25/2018  2:30 PM Jackey Loge, PT [facility]   05/30/2018  3:00 PM Jackey Loge, PT [facility]   06/01/2018  3:00 PM Jackey Loge, PT [facility]   06/06/2018  2:30 PM Gwenlyn Perking Noreene Filbert, PT [facility]

## 2018-05-16 NOTE — Progress Notes (Signed)
PHYSICAL THERAPY - DAILY TREATMENT NOTE    Patient Name: Jessica Stanley        Date: 05/16/2018  DOB: [birth year]   yes Patient DOB Verified  Visit #:   2   of   12  Insurance: Payor: BLUE CROSS / Plan: [employer] / Product Type: HMO /      In time: 525 Out time: 615   Total Treatment Time: 50     Medicare/BCBS Time Tracking (below)   Total Timed Codes (min):  50 1:1 Treatment Time:  30     TREATMENT AREA =  Pain in left hip [M25.552]    SUBJECTIVE  Pain Level (on 0 to 10 scale):  4  / 10   Medication Changes/New allergies or changes in medical history, any new surgeries or procedures?    no  If yes, update Summary List   Subjective Functional Status/Changes:  []   No changes reported     Patient reports the back bend exercise seems to help, but she continues to experience pain down the front of her thigh on a regular basis.           OBJECTIVE    30 min Therapeutic Exercise:  [x]   See flow sheet   Rationale:      increase ROM and increase strength to improve the patient???s ability to perform recreational activities.      20 min Manual Therapy: STM to L lumbar para, L QL, L RF; A.R.T to L RF; sacral mobs   Rationale:      decrease pain, increase ROM, increase tissue extensibility and decrease trigger points to improve patient's ability to perform walking activities.       Billed With/As:   [x]  TE   []  TA   []  Neuro   []  Self Care Patient Education: [x]  Review HEP    []  Progressed/Changed HEP based on:   []  positioning   []  body mechanics   []  transfers   []  heat/ice application    []  other:      Other Objective/Functional Measures:    1:1 TE 10'  Progressed program per flowsheet for improved core stability  Patient demonstrating increased difficulty with stabilization of L pelvis     Post Treatment Pain Level (on 0 to 10) scale:   0  / 10     ASSESSMENT  Assessment/Changes in Function:     Patient noted reduced pain post session. Plan to continue with program  designed to improve core and pelvic stability while reducing nerve irritation in L4-L5 nerve distribution.     []   See Progress Note/Recertification   Patient will continue to benefit from skilled PT services to modify and progress therapeutic interventions, address functional mobility deficits, address ROM deficits, address strength deficits, analyze and address soft tissue restrictions, analyze and cue movement patterns and analyze and modify body mechanics/ergonomics to attain remaining goals.   Progress toward goals / Updated goals:    Progressing with STG#1     PLAN  [x]   Upgrade activities as tolerated yes Continue plan of care   []   Discharge due to :    []   Other:      Therapist: Jackey Loge, PT    Date: 05/16/2018 Time: 6:50 PM     Future Appointments   Date Time Provider Department Center   05/18/2018  5:30 PM Jackey Loge, PT [facility]   05/23/2018  2:30 PM Gwenlyn Perking Noreene Filbert, PT [facility]  05/25/2018  2:30 PM Jackey Loge, PT [facility]   05/30/2018  3:00 PM Jackey Loge, PT [facility]   06/01/2018  3:00 PM Jackey Loge, PT [facility]   06/06/2018  2:30 PM Gwenlyn Perking Noreene Filbert, PT [facility]

## 2018-05-18 ENCOUNTER — Inpatient Hospital Stay: Admit: 2018-05-18 | Payer: BLUE CROSS/BLUE SHIELD | Primary: Family Medicine

## 2018-05-18 NOTE — Progress Notes (Signed)
 [facility] - [facility]ERAP[facility]  [address],[place], [place] [address] - Phone: [phone]  Fax: [phone]  PROGRESS NOTE  Patient Name: Jessica Stanley DOB: February 20, 1961   Treatment/Medical Diagnosis: Pain in left hip [M25.552]   Referral Source: Chesley Deward [place], [place]     Date of Initial Visit: 05/11/18 Attended Visits: 3 Missed Visits: 0     SUMMARY OF TREATMENT  Patient has attended 2 follow up visits since her initial evaluation on 05/11/18. Patient has received therapeutic exercise and manual therapy in order to improve L hip ROM, mobility, flexibility, strength, stability and pain reduction.   CURRENT STATUS and Recommendations.   Pt temporarily placed on hold due to the nationwide concerns over COVID-19. Pt issued HEP and therapy staff will continue to contact pt every 1-2 weeks via phone to monitor progress. Plan to resume physical therapy once concerns improve. Thank you    If you have any questions/comments please contact us  directly at [phone].   Thank you for allowing us  to assist in the care of your patient.    Therapist Signature: Beverley JONETTA Cera, PT Date: 06/05/2018     Time: 12:01 PM   NOTE TO PHYSICIAN:  PLEASE COMPLETE THE ORDERS BELOW AND FAX TO   [facility]: [phone]  If you are unable to process this request in 24 hours please contact our office: [phone]    ___ I have read the above report and request that my patient continue as recommended.   ___ I have read the above report and request that my patient continue therapy with the following changes/special instructions:_________________________________________________________   ___ I have read the above report and request that my patient be discharged from therapy.     Physician Signature:        Date:       Time:

## 2018-05-18 NOTE — Progress Notes (Signed)
 [name]ER[facility]  [address], [place], [place] [address] - Phone: [phone]  Fax: [phone]  DISCHARGE SUMMARY  Patient Name: Jessica Stanley DOB: 03/31/1960   Treatment/Medical Diagnosis: Pain in left hip [M25.552]   Referral Source: Chesley Deward [place], [place]     Date of Initial Visit: 05/11/18 Attended Visits: 3 Missed Visits: 0     SUMMARY OF TREATMENT  Patient has attended 2 follow up visits since her initial evaluation on 05/11/18. Patient has received therapeutic exercise and manual therapy in order to improve L hip ROM, mobility, flexibility, strength, stability and pain reduction.   CURRENT STATUS  Patient has not returned to PT since 05/18/18 due to COVID-19. At this time, patient will be discharged from PT. A formal re-assessment was not performed and therefore goals have not been updated.   RECOMMENDATIONS  Other: COVID-19    If you have any questions/comments please contact us  directly at [phone].   Thank you for allowing us  to assist in the care of your patient.    Therapist Signature: Beverley JONETTA Cera, PT Date: 10/16/18     Time: 3:03 PM

## 2018-05-18 NOTE — Progress Notes (Signed)
PHYSICAL THERAPY - COURTESY CHECK-IN PHONE CALL    Patient Name: Jessica Stanley        Date: 06/20/2018  DOB: 12/09/1960   yes Patient DOB Verified  Insurance: Payor: BLUE CROSS / Plan: [employer] / Product Type: HMO /      Start time: 333 End time: 1     Detail of Conversation:    Discussed with patient new schedule, patient states she is doing well and has been doing her exercises consistently. Patient feels good with being discharged at this time.     Follow-up Actions:    []  Check-in via phone in 1-2 weeks  []  Provide updated HEP  [x]  Discharge no further need for check-ins (write DC note and send to physician)    Other: n/a      Therapist: Jackey Loge, PT    Date: 06/20/2018 Time: 3:34 PM

## 2018-05-18 NOTE — Progress Notes (Signed)
 PHYSICAL THERAPY - DAILY TREATMENT NOTE    Patient Name: Jessica Stanley        Date: 05/18/2018  DOB: 03/21/1960   yes Patient DOB Verified  Visit #:   3   of   12  Insurance: Payor: BLUE CROSS / Plan: [employer] / Product Type: HMO /      In time: 528 Out time: 615   Total Treatment Time: 47     Medicare/BCBS Time Tracking (below)   Total Timed Codes (min):  47 1:1 Treatment Time:  42     TREATMENT AREA =  Pain in left hip [M25.552]    SUBJECTIVE  Pain Level (on 0 to 10 scale):  2  / 10   Medication Changes/New allergies or changes in medical history, any new surgeries or procedures?    no  If yes, update Summary List   Subjective Functional Status/Changes:  []   No changes reported     Patient reports she felt good after her last visit and wasn't experiencing any pain. Patient also reports yesterday seemed to be a good day.           OBJECTIVE    29 min Therapeutic Exercise:  [x]   See flow sheet   Rationale:      increase ROM, increase strength and increase proprioception to improve the patient's ability to perform walking activities.      18 min Manual Therapy: STM to L lumbar para, L QL, L RF; STM to L RF; sacral mobs   Rationale:      decrease pain, increase ROM, increase tissue extensibility and decrease trigger points to improve patient's ability to perform recreational activities.     Billed With/As:   [x]  TE   []  TA   []  Neuro   []  Self Care Patient Education: [x]  Review HEP    []  Progressed/Changed HEP based on:   []  positioning   []  body mechanics   []  transfers   []  heat/ice application    []  other:      Other Objective/Functional Measures:    1;1 TE 33'  Patient required cuing on proper pelvic positioning during quadruped hip ext; improve stability noted with half kneel chop this session compared to previous visit  Continued with significant tenderness with reproduction of symptoms during palpation of L4-L5 region      Post Treatment Pain Level (on 0 to 10) scale:   0  / 10      ASSESSMENT  Assessment/Changes in Function:     Patient noted reduced pain post session. Provided updated copy of HEP and instructed on home use, patient acknowledged understanding.      []   See Progress Note/Recertification   Patient will continue to benefit from skilled PT services to modify and progress therapeutic interventions, address functional mobility deficits, address ROM deficits, address strength deficits, analyze and address soft tissue restrictions, analyze and cue movement patterns, analyze and modify body mechanics/ergonomics and assess and modify postural abnormalities to attain remaining goals.   Progress toward goals / Updated goals:    Progressing with STG#2     PLAN  [x]   Upgrade activities as tolerated yes Continue plan of care   []   Discharge due to :    []   Other:      Therapist: Beverley JONETTA Cera, PT    Date: 05/18/2018 Time: 6:31 PM     Future Appointments   Date Time Provider Department Center   05/23/2018  2:30 PM Bartolomei,  Beverley BIRCH, PT [facility]   05/25/2018  2:30 PM [place] Beverley BIRCH, PT [facility]   05/30/2018  3:00 PM [place] Beverley BIRCH, PT [facility]   06/01/2018  3:00 PM [place] Beverley BIRCH, PT [facility]   06/06/2018  2:30 PM [place] Beverley BIRCH, PT [facility]

## 2018-05-18 NOTE — Progress Notes (Signed)
PHYSICAL THERAPY - COURTESY CHECK-IN PHONE CALL    Patient Name: Jessica Stanley        Date: 06/02/2018  DOB: 1960-04-09   yes Patient DOB Verified  Insurance: Payor: BLUE CROSS / Plan: [employer] / Product Type: HMO /      Start time: 1124 End time: 1127     Detail of Conversation:    Discussed patient symptoms, reports she is feeling better and staying consistent with HEP. Let patient know of current scheduling, patient would wish to stay active in PT system in order to assess how she responds to independent program. Patient will continue with current HEP and plan to re-assess in 1-2 weeks.    Follow-up Actions:    [x]  Check-in via phone in 1-2 weeks  [x]  Provide updated HEP  []  Discharge no further need for check-ins (write DC note and send to physician)    Other: Re-assess response to program in 1-2 weeks.      Therapist: Jackey Loge, PT    Date: 06/02/2018 Time: 11:24 AM

## 2018-05-18 NOTE — Progress Notes (Signed)
PHYSICAL THERAPY - DAILY TREATMENT NOTE    Patient Name: Jessica Stanley        Date: 05/18/2018  DOB: 02-27-1961   yes Patient DOB Verified  Visit #:   3   of   12  Insurance: Payor: BLUE CROSS / Plan: [employer] / Product Type: HMO /      In time: 528 Out time: 615   Total Treatment Time: 47     Medicare/BCBS Time Tracking (below)   Total Timed Codes (min):  47 1:1 Treatment Time:  42     TREATMENT AREA =  Pain in left hip [M25.552]    SUBJECTIVE  Pain Level (on 0 to 10 scale):  2  / 10   Medication Changes/New allergies or changes in medical history, any new surgeries or procedures?    no  If yes, update Summary List   Subjective Functional Status/Changes:  []   No changes reported     Patient reports she felt good after her last visit and wasn't experiencing any pain. Patient also reports yesterday seemed to be a good day.           OBJECTIVE    29 min Therapeutic Exercise:  [x]   See flow sheet   Rationale:      increase ROM, increase strength and increase proprioception to improve the patient???s ability to perform walking activities.      18 min Manual Therapy: STM to L lumbar para, L QL, L RF; STM to L RF; sacral mobs   Rationale:      decrease pain, increase ROM, increase tissue extensibility and decrease trigger points to improve patient's ability to perform recreational activities.     Billed With/As:   [x]  TE   []  TA   []  Neuro   []  Self Care Patient Education: [x]  Review HEP    []  Progressed/Changed HEP based on:   []  positioning   []  body mechanics   []  transfers   []  heat/ice application    []  other:      Other Objective/Functional Measures:    1;1 TE 61'  Patient required cuing on proper pelvic positioning during quadruped hip ext; improve stability noted with half kneel chop this session compared to previous visit  Continued with significant tenderness with reproduction of symptoms during palpation of L4-L5 region      Post Treatment Pain Level (on 0 to 10) scale:   0  / 10     ASSESSMENT   Assessment/Changes in Function:     Patient noted reduced pain post session. Provided updated copy of HEP and instructed on home use, patient acknowledged understanding.      []   See Progress Note/Recertification   Patient will continue to benefit from skilled PT services to modify and progress therapeutic interventions, address functional mobility deficits, address ROM deficits, address strength deficits, analyze and address soft tissue restrictions, analyze and cue movement patterns, analyze and modify body mechanics/ergonomics and assess and modify postural abnormalities to attain remaining goals.   Progress toward goals / Updated goals:    Progressing with STG#2     PLAN  [x]   Upgrade activities as tolerated yes Continue plan of care   []   Discharge due to :    []   Other:      Therapist: Jackey Loge, PT    Date: 05/18/2018 Time: 6:31 PM     Future Appointments   Date Time Provider Department Center   05/23/2018  2:30 PM Lelar Farewell,  Noreene Filbert, PT [facility]   05/25/2018  2:30 PM Jackey Loge, PT [facility]   05/30/2018  3:00 PM Jackey Loge, PT [facility]   06/01/2018  3:00 PM Jackey Loge, PT [facility]   06/06/2018  2:30 PM Gwenlyn Perking Noreene Filbert, PT [facility]

## 2018-05-18 NOTE — Progress Notes (Signed)
PHYSICAL THERAPY - COURTESY CHECK-IN PHONE CALL    Patient Name: Jessica Stanley        Date: 06/20/2018  DOB: 12/09/1960   yes Patient DOB Verified  Insurance: Payor: BLUE CROSS / Plan: [employer] / Product Type: HMO /      Start time: 333 End time: 338     Detail of Conversation:    Discussed with patient new schedule, patient states she is doing well and has been doing her exercises consistently. Patient feels good with being discharged at this time.     Follow-up Actions:    [] Check-in via phone in 1-2 weeks  [] Provide updated HEP  [x] Discharge no further need for check-ins (write DC note and send to physician)    Other: n/a      Therapist: Tyner Codner D Launi Asencio, PT    Date: 06/20/2018 Time: 3:34 PM

## 2018-05-18 NOTE — Progress Notes (Signed)
PHYSICAL THERAPY - COURTESY CHECK-IN PHONE CALL    Patient Name: Jessica Stanley        Date: 06/02/2018  DOB: 05/29/1960   yes Patient DOB Verified  Insurance: Payor: BLUE CROSS / Plan: [employer] / Product Type: HMO /      Start time: 1124 End time: 1127     Detail of Conversation:    Discussed patient symptoms, reports she is feeling better and staying consistent with HEP. Let patient know of current scheduling, patient would wish to stay active in PT system in order to assess how she responds to independent program. Patient will continue with current HEP and plan to re-assess in 1-2 weeks.    Follow-up Actions:    [x] Check-in via phone in 1-2 weeks  [x] Provide updated HEP  [] Discharge no further need for check-ins (write DC note and send to physician)    Other: Re-assess response to program in 1-2 weeks.      Therapist: Caitlain Tweed D Orla Jolliff, PT    Date: 06/02/2018 Time: 11:24 AM

## 2018-05-18 NOTE — Progress Notes (Signed)
[facility] ?[facility]  [address], [address], [place] [address] - Phone: [phone]  Fax: [phone]  DISCHARGE SUMMARY  Patient Name: Jessica Stanley DOB: 05/09/1960   Treatment/Medical Diagnosis: Pain in left hip [M25.552]   Referral Source: Versage, Paul T, [place]     Date of Initial Visit: 05/11/18 Attended Visits: 3 Missed Visits: 0     SUMMARY OF TREATMENT  Patient has attended 2 follow up visits since her initial evaluation on 05/11/18. Patient has received therapeutic exercise and manual therapy in order to improve L hip ROM, mobility, flexibility, strength, stability and pain reduction.   CURRENT STATUS  Patient has not returned to PT since 05/18/18 due to COVID-19. At this time, patient will be discharged from PT. A formal re-assessment was not performed and therefore goals have not been updated.   RECOMMENDATIONS  Other: COVID-19    If you have any questions/comments please contact us directly at [phone].   Thank you for allowing us to assist in the care of your patient.    Therapist Signature: Ladina Shutters D Denvil Canning, PT Date: 10/16/18     Time: 3:03 PM

## 2018-05-18 NOTE — Progress Notes (Signed)
[facility] ??? [facility]  [address],[place] [address] - Phone: [phone]  Fax: [phone]  PROGRESS NOTE  Patient Name: Jessica Stanley DOB: 02/20/1961   Treatment/Medical Diagnosis: Pain in left hip [M25.552]   Referral Source: Versage, Paul T, [place]     Date of Initial Visit: 05/11/18 Attended Visits: 3 Missed Visits: 0     SUMMARY OF TREATMENT  Patient has attended 2 follow up visits since her initial evaluation on 05/11/18. Patient has received therapeutic exercise and manual therapy in order to improve L hip ROM, mobility, flexibility, strength, stability and pain reduction.   CURRENT STATUS and Recommendations.   Pt temporarily placed on hold due to the nationwide concerns over COVID-19. Pt issued HEP and therapy staff will continue to contact pt every 1-2 weeks via phone to monitor progress. Plan to resume physical therapy once concerns improve. Thank you    If you have any questions/comments please contact us directly at [phone].   Thank you for allowing us to assist in the care of your patient.    Therapist Signature: Eriel Dunckel D Jacey Eckerson, PT Date: 06/05/2018     Time: 12:01 PM   NOTE TO PHYSICIAN:  PLEASE COMPLETE THE ORDERS BELOW AND FAX TO   [facility]: [phone]  If you are unable to process this request in 24 hours please contact our office: [phone]    ___ I have read the above report and request that my patient continue as recommended.   ___ I have read the above report and request that my patient continue therapy with the following changes/special instructions:_________________________________________________________   ___ I have read the above report and request that my patient be discharged from therapy.     Physician Signature:        Date:       Time:

## 2018-05-23 ENCOUNTER — Encounter: Payer: BLUE CROSS/BLUE SHIELD | Primary: Family Medicine

## 2018-05-25 ENCOUNTER — Encounter: Payer: BLUE CROSS/BLUE SHIELD | Primary: Family Medicine

## 2018-05-30 ENCOUNTER — Encounter: Payer: BLUE CROSS/BLUE SHIELD | Primary: Family Medicine

## 2018-06-01 ENCOUNTER — Encounter: Payer: BLUE CROSS/BLUE SHIELD | Primary: Family Medicine

## 2018-06-06 ENCOUNTER — Encounter: Payer: BLUE CROSS/BLUE SHIELD | Primary: Family Medicine

## 2019-04-26 ENCOUNTER — Encounter: Payer: BLUE CROSS/BLUE SHIELD | Primary: Family Medicine

## 2020-03-05 IMAGING — MG MAMMOGRAPHY SCREENING BILATERAL 3D TOMOSYNTHESIS WITH CAD
7 series · 9 of 23 positions shown · non-contrast
Comparison: 01/09/2019.

MAMMOGRAPHY SCREENING BILATERAL 3D TOMOSYNTHESIS WITH CAD, 03/05/2020 [DATE]: 
CLINICAL INDICATION: Screening exam.
TECHNIQUE: Digital bilateral mammograms and 3-D Tomosynthesis were obtained. 
These were interpreted both primarily and with the aid of computer-aided 
detection system. 
BREAST DENSITY: (Level B) There are scattered areas of fibroglandular density.

[L MLO]
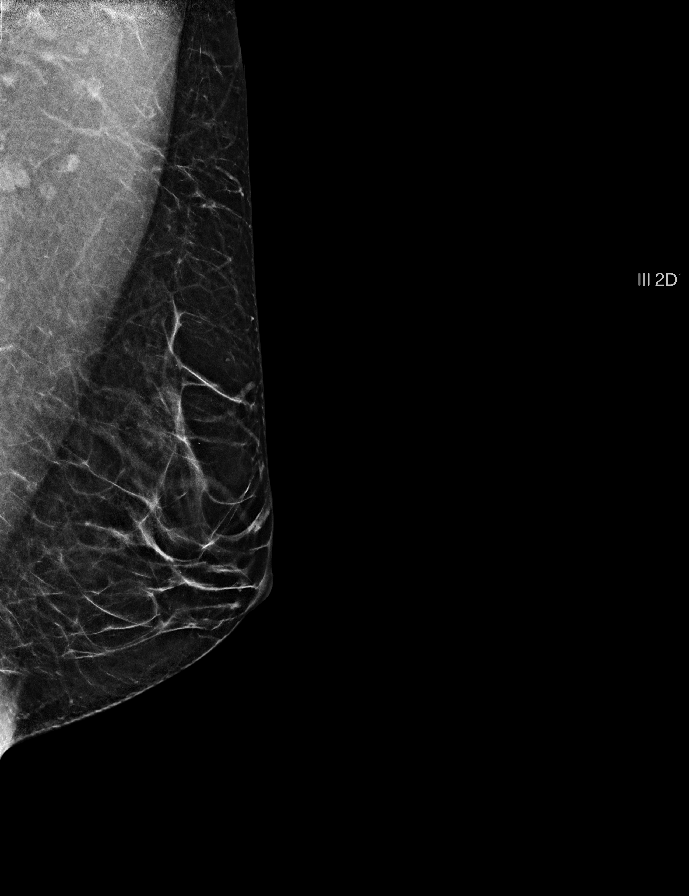

[L CC]
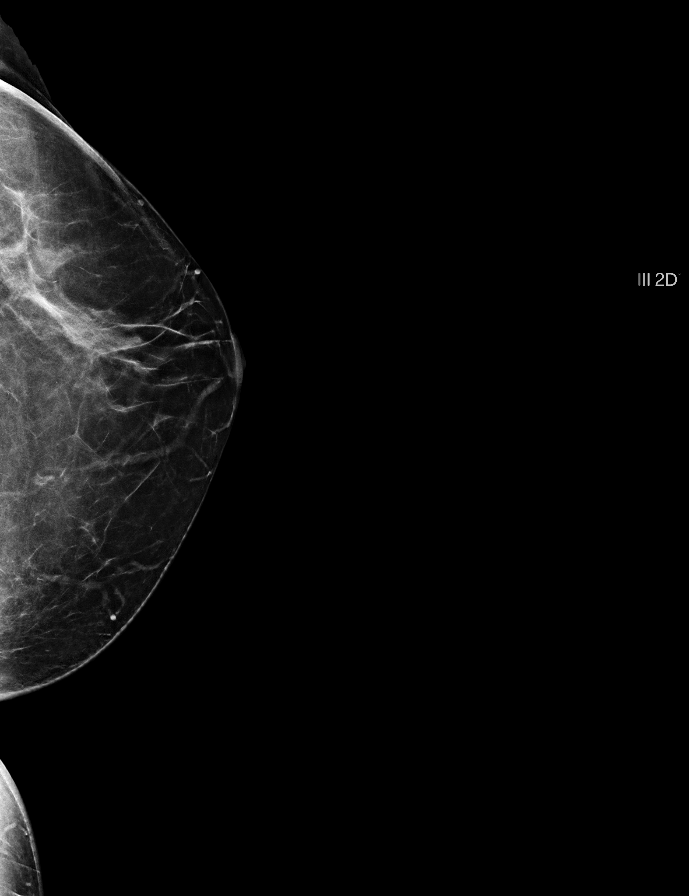

[R MLO]
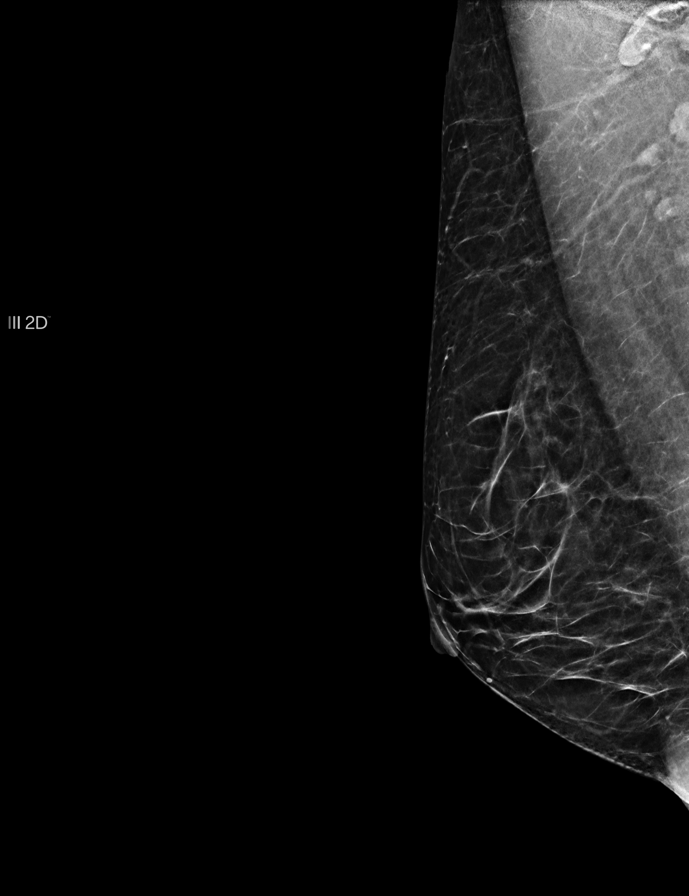

[R MLO tomo · 3 of 59 frames shown]
[frame 20/59]
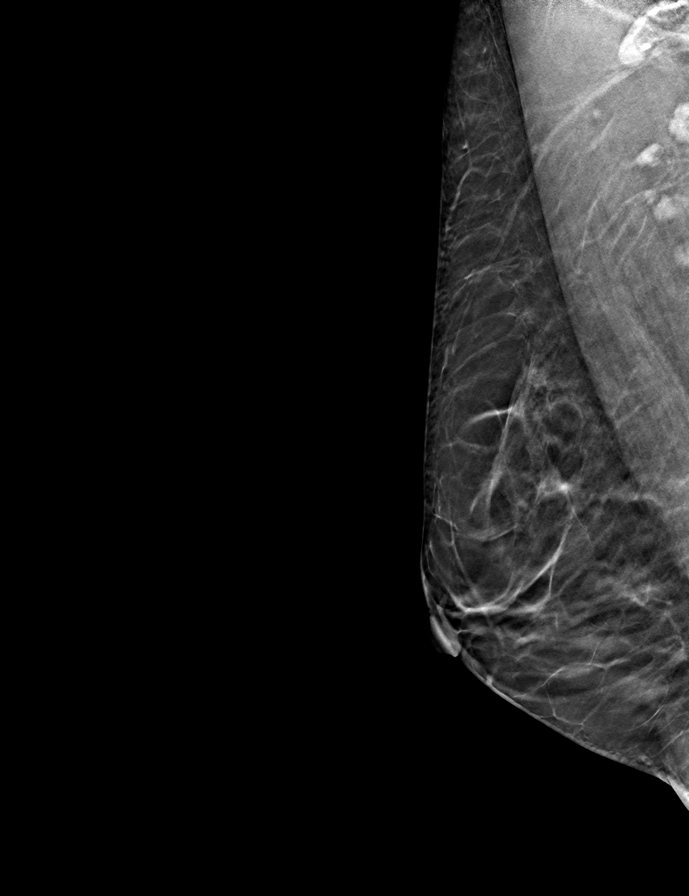
[frame 30/59]
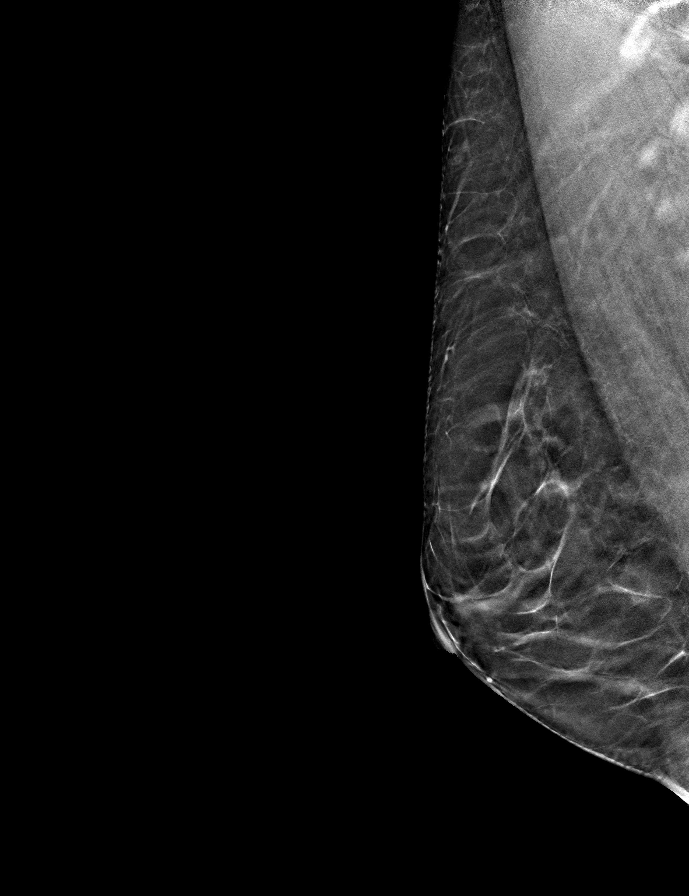
[frame 40/59]
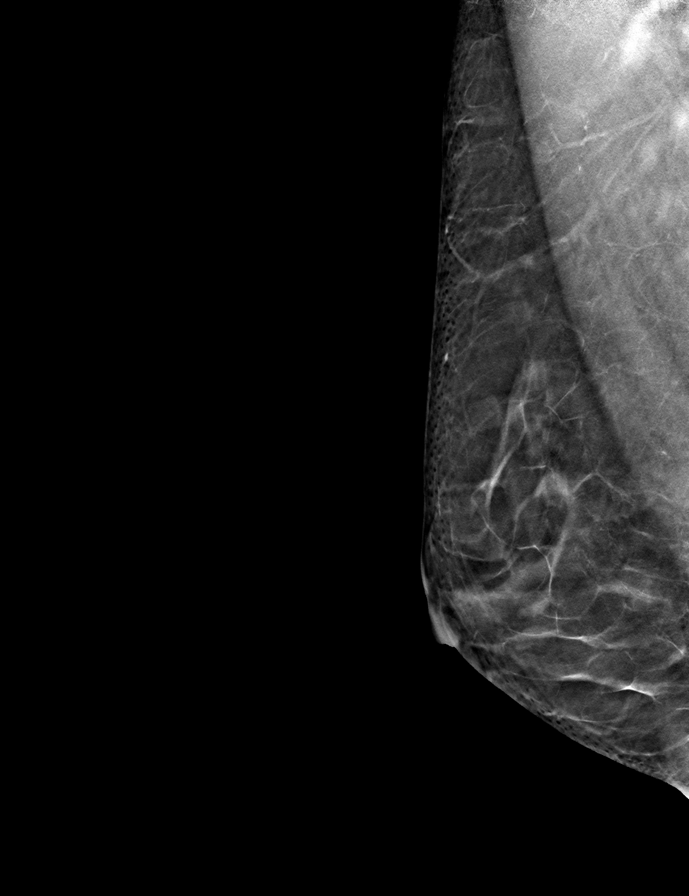

[R CC tomo · tomo slice 37/73.0]
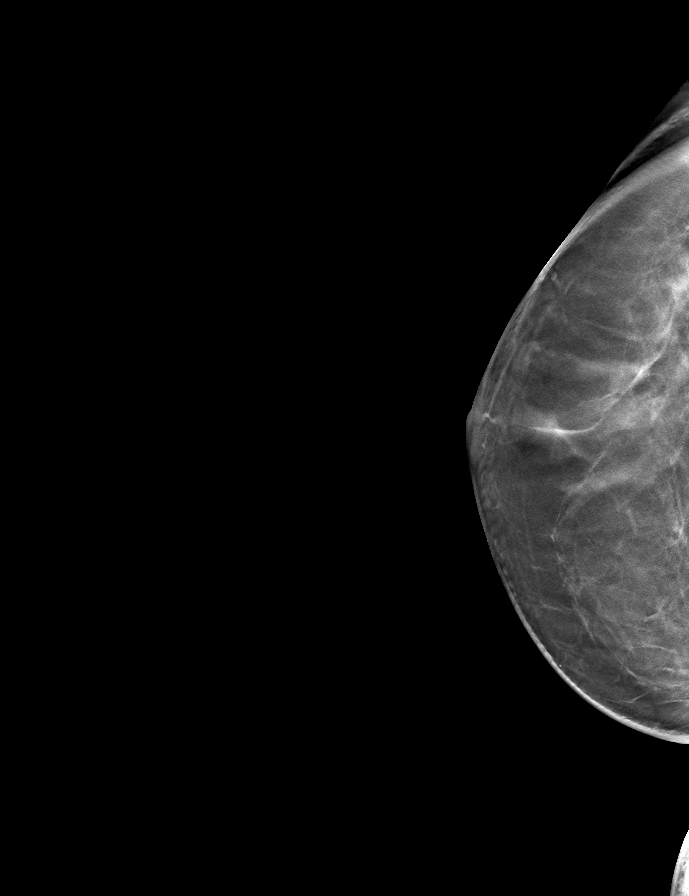

[L MLO tomo · tomo slice 31/62.0]
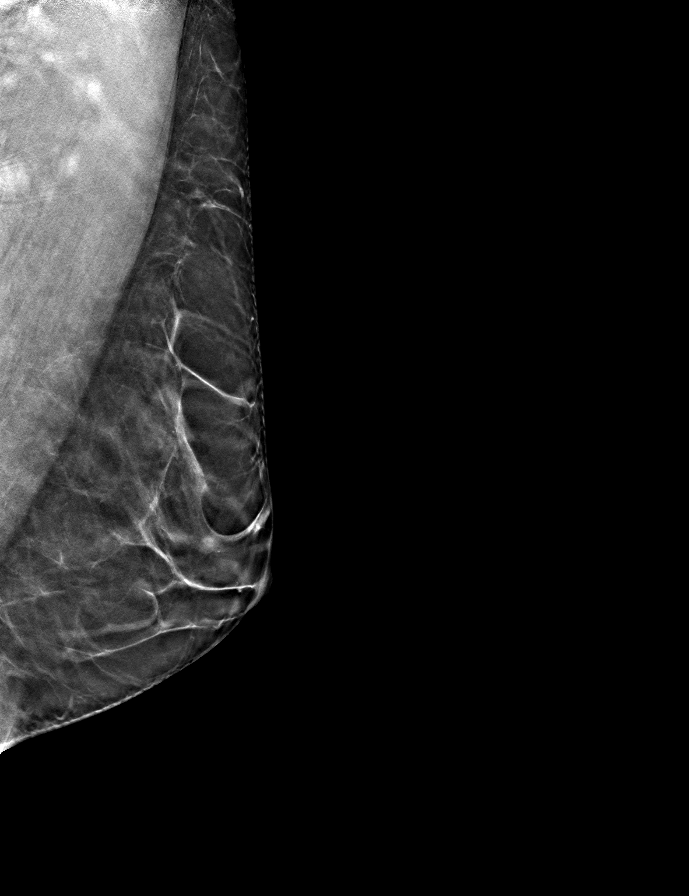

[L CC tomo · tomo slice 37/72.0]
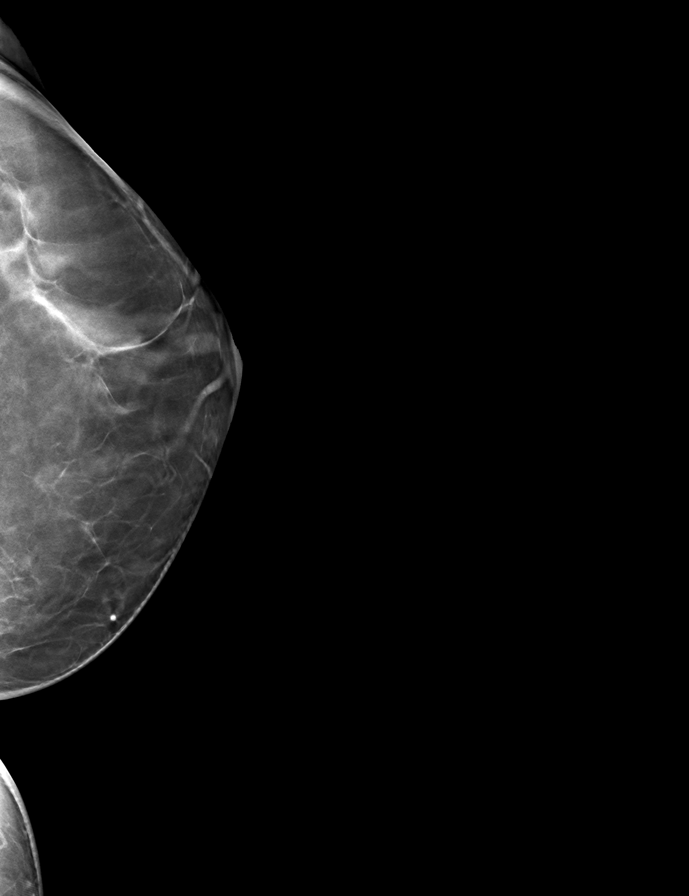

[9 of 23 positions shown; findings below may reference images not displayed]

FINDINGS: No suspicious mass, calcifications, or area of architectural 
distortion in either breast.
IMPRESSION: Stable mammogram. 
( BI-RADS 1) Negative mammogram. Routine mammographic follow-up is recommended.

## 2020-12-26 IMAGING — CT CT CHEST WITHOUT CONTRAST
2 of 3 series · 15 of 34 positions shown, 18 images · non-contrast
Comparison: Outside chest CT December 09, 2016

________________________________________________________________________________________________ 
CT CHEST WITHOUT CONTRAST, 12/26/2020 [DATE]: 
CLINICAL INDICATION:  Follow-up solitary pulmonary nodule 
A search for DICOM formatted images was conducted for prior CT imaging studies 
completed at a non-affiliated media free facility.
TECHNIQUE: The chest was scanned from base of neck through the lung bases 
without contrast on a high resolution low dose CT scanner. Routine MPR and MIP 
3D renderings were reconstructed on an independent workstation with concurrent 
physician supervision.

[Series 2: axial · axial · 0.70mm/px · z∈[+1063,+1343]mm · 12 of 156 slices shown, 15 images]
[im 8/156  mediastinal]
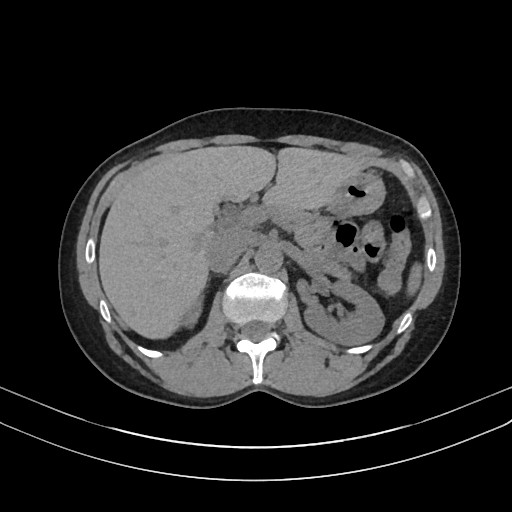
[im 8/156  lung]
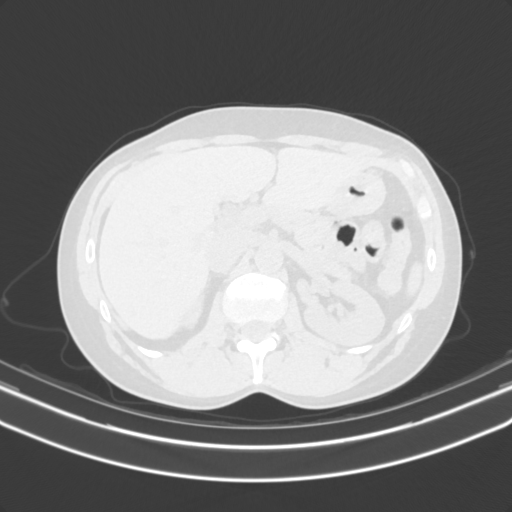
[im 23/156  lung]
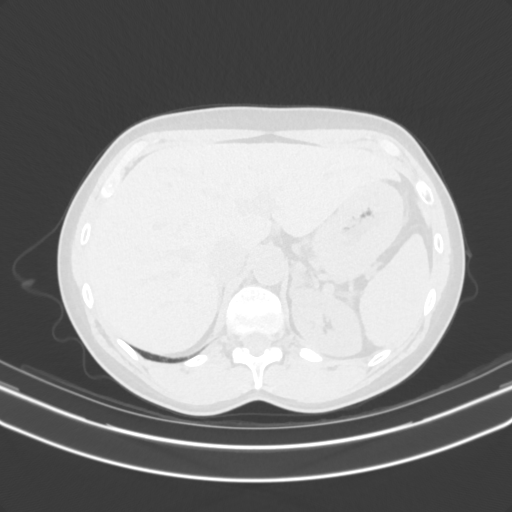
[im 37/156  lung]
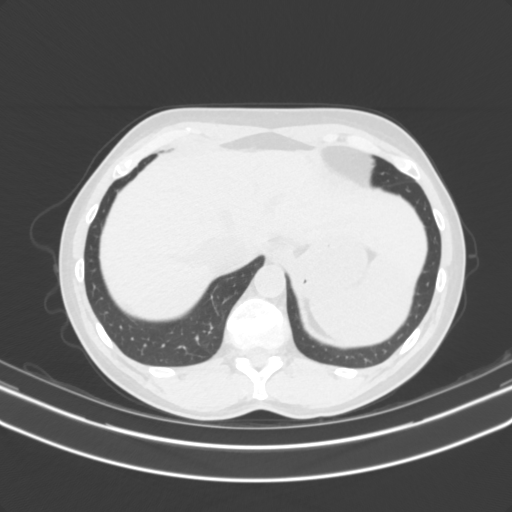
[im 45/156  lung]
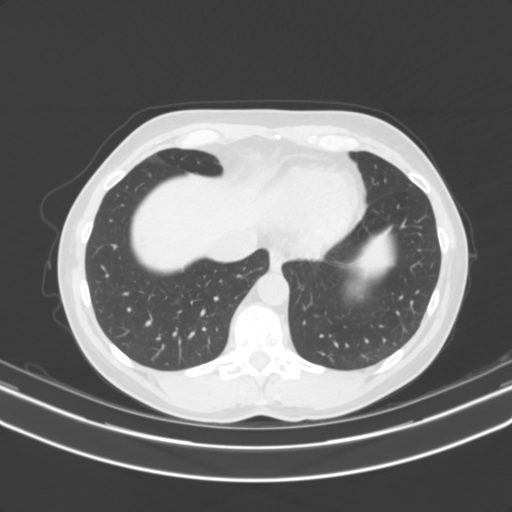
[im 60/156  mediastinal]
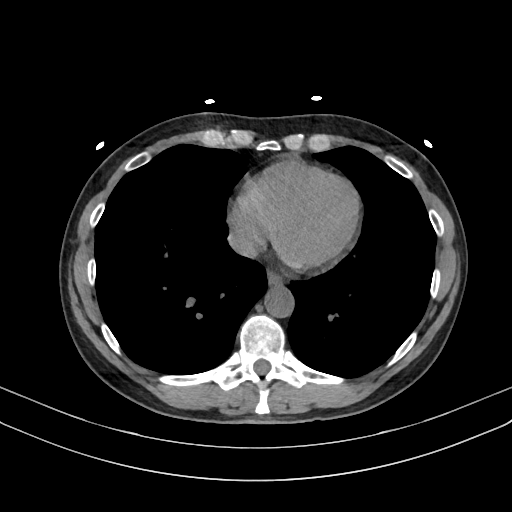
[im 60/156  lung]
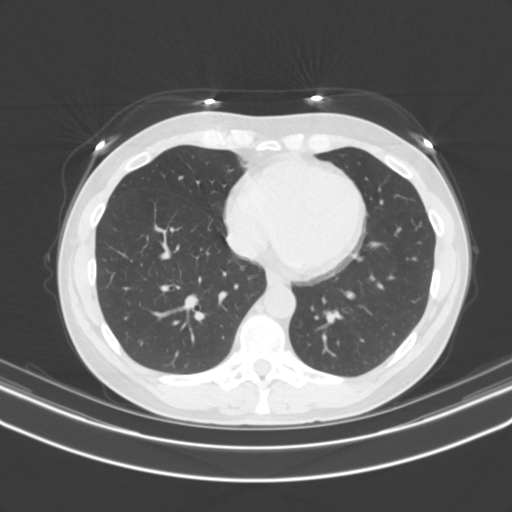
[im 74/156  lung]
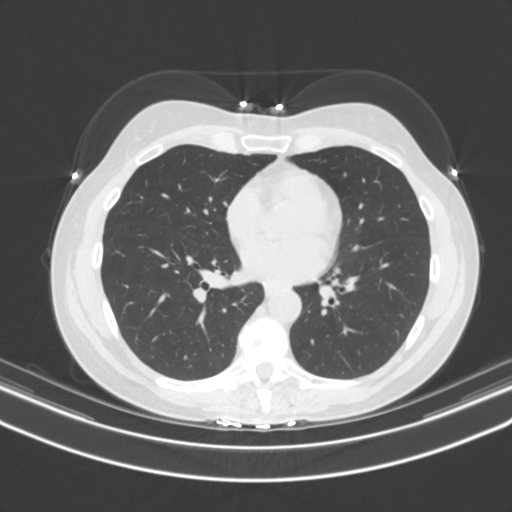
[im 82/156  lung]
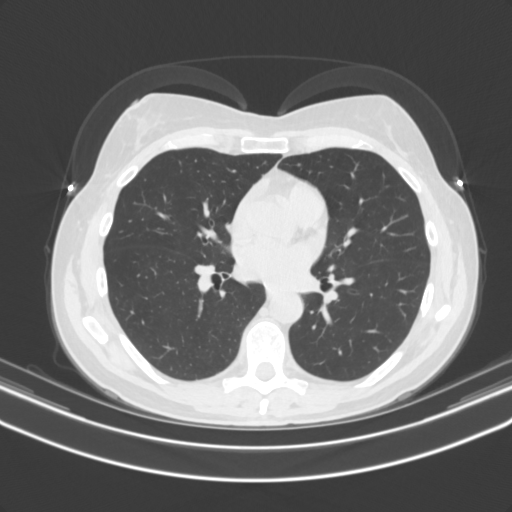
[im 96/156  lung]
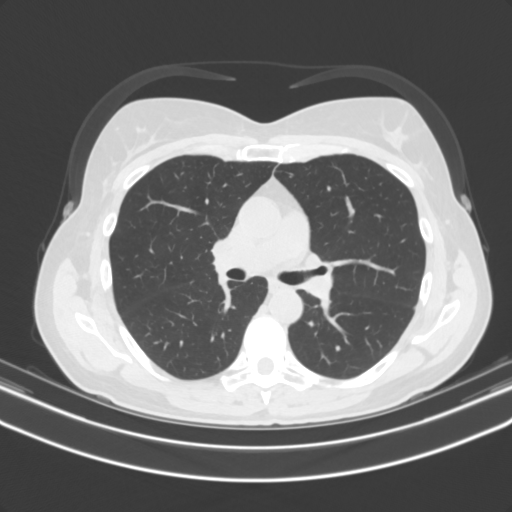
[im 111/156  mediastinal]
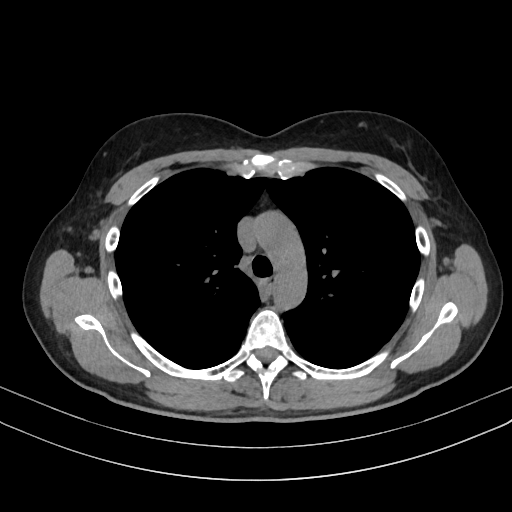
[im 111/156  lung]
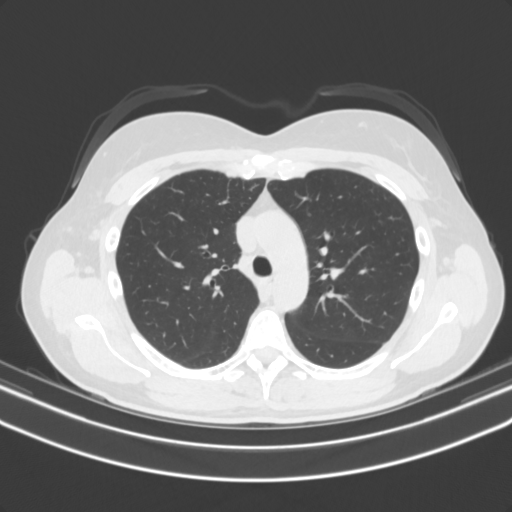
[im 119/156  lung]
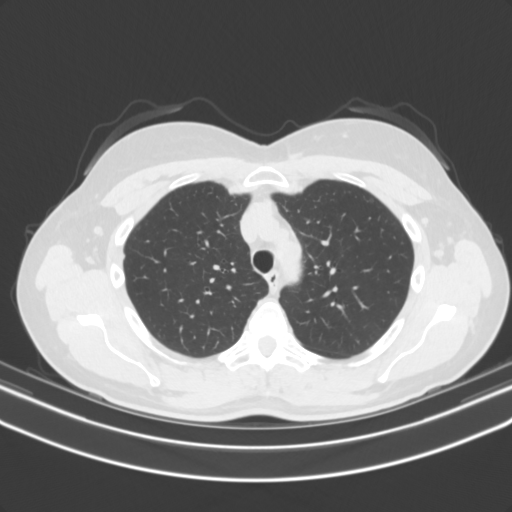
[im 133/156  lung]
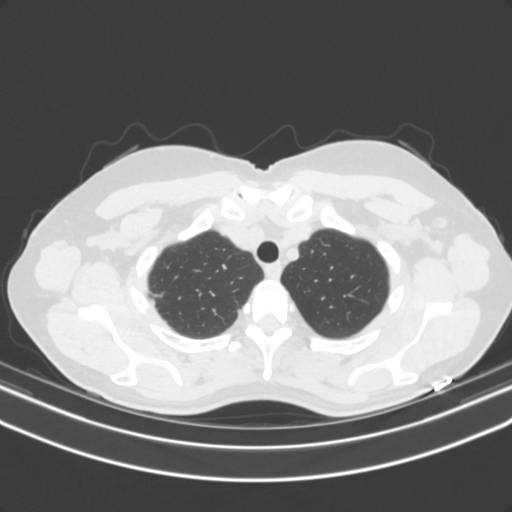
[im 148/156  lung]
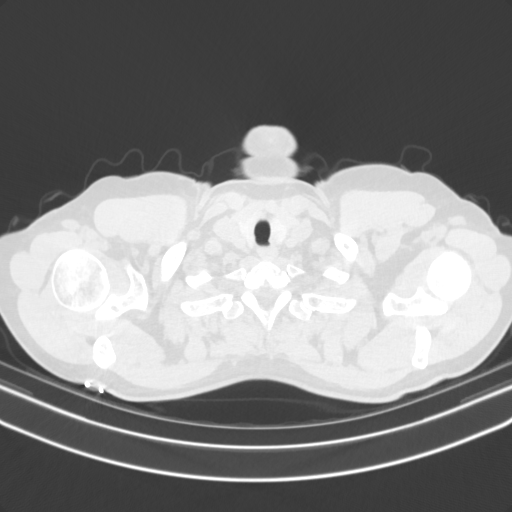

[Series 4: cor · coronal · 0.61mm/px · 3 of 112 slices shown]
[im 23/112  lung]
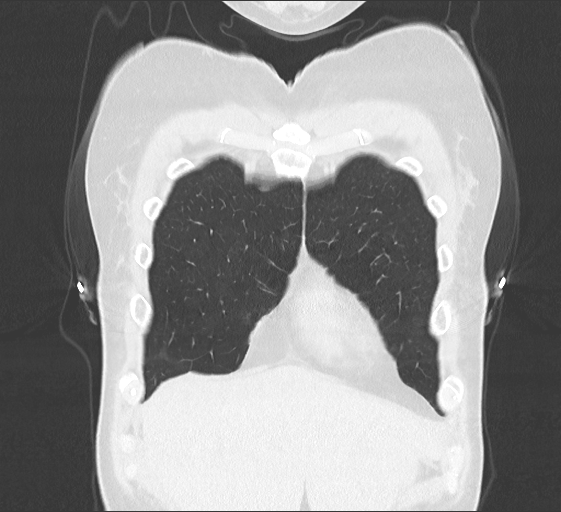
[im 45/112  lung]
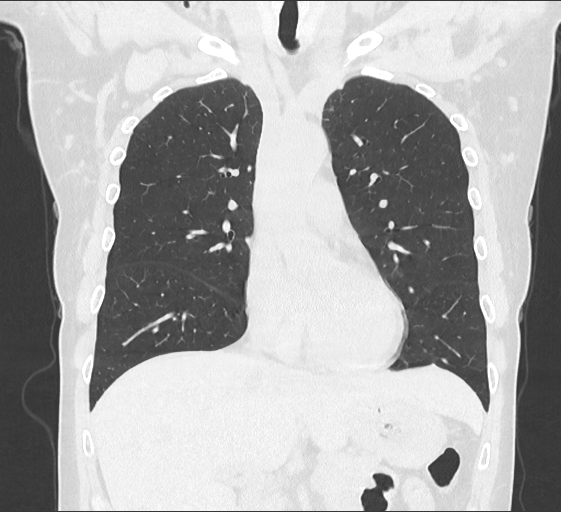
[im 67/112  lung]
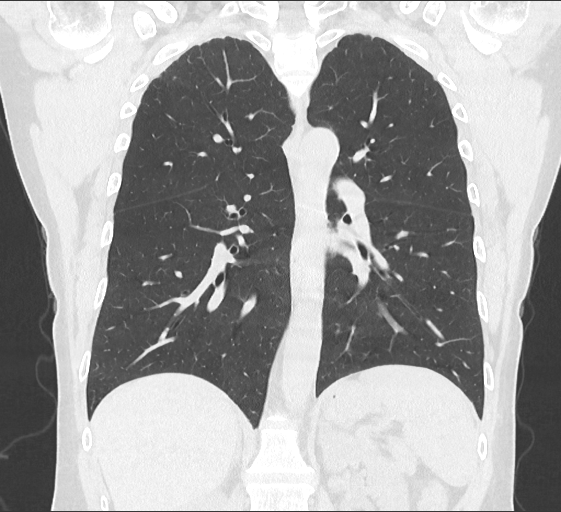

[15 of 34 positions shown; findings below may reference images not displayed]

FINDINGS: Previously seen 2 mm anterior left upper lobe nodule is stable, axial 
image 34. No new nodule. Linear atelectasis at the left base. No infiltrate or 
effusion. No tracheal or endobronchial lesion. Thyroid lobes are small. There is 
no mediastinal or axillary adenopathy. The heart is not enlarged. No adrenal 
nodule. Visualized liver and spleen are unremarkable. Aorta shows normal 
caliber. There are degenerative changes.
IMPRESSION: Stable 2 mm left upper lobe nodule. No new nodule, adenopathy or effusion. 
RADIATION DOSE REDUCTION: All CT scans are performed using radiation dose 
reduction techniques, when applicable.  Technical factors are evaluated and 
adjusted to ensure appropriate moderation of exposure.  Automated dose 
management technology is applied to adjust the radiation doses to minimize 
exposure while achieving diagnostic quality images.

## 2021-03-16 IMAGING — MG MAMMOGRAPHY SCREENING BILATERAL 3[PERSON_NAME]
8 series · 9 of 24 positions shown · non-contrast
Comparison: Comparison was made to prior examinations.

________________________________________________________________________________________________ 
MAMMOGRAPHY SCREENING BILATERAL 3ARE PUSEY, 03/16/2021 [DATE]: 
CLINICAL INDICATION: Screening.
TECHNIQUE: Digital bilateral mammograms and 3-D Tomosynthesis were obtained. 
These were interpreted both primarily and with the aid of computer-aided 
detection system.  
BREAST DENSITY: (Level B) There are scattered areas of fibroglandular density.

[L CC]
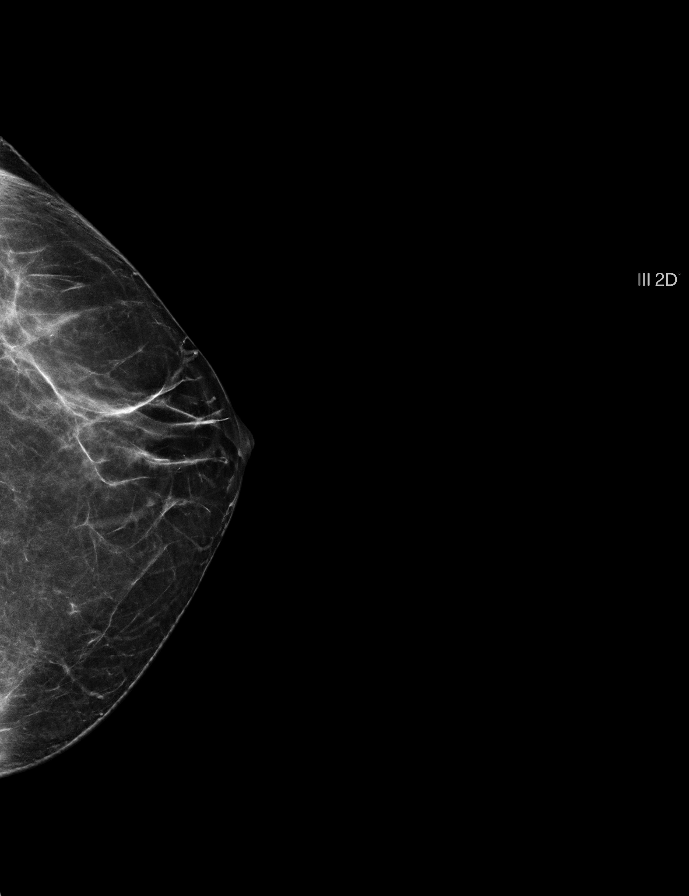

[R CC]
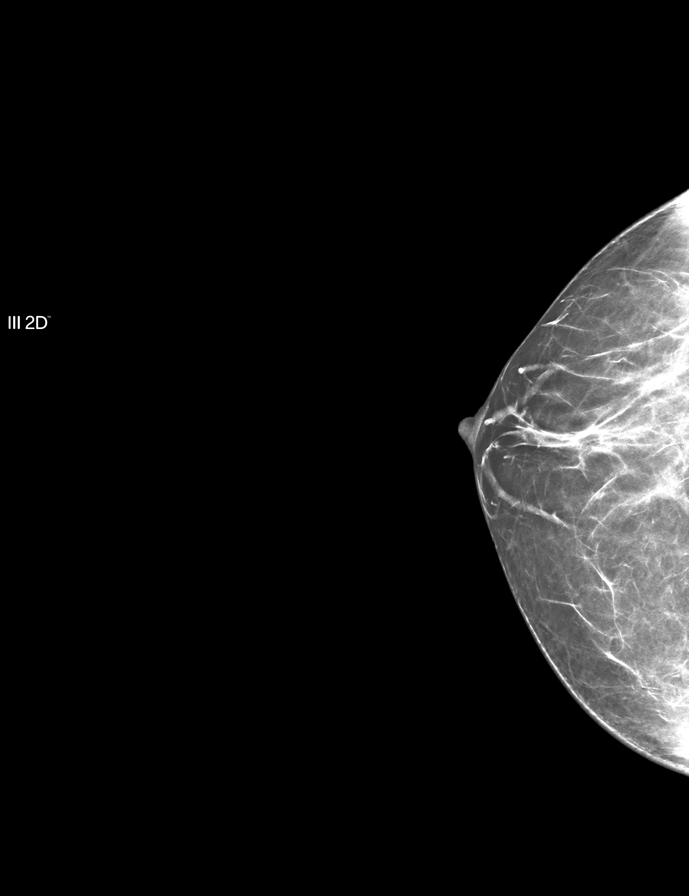

[R MLO]
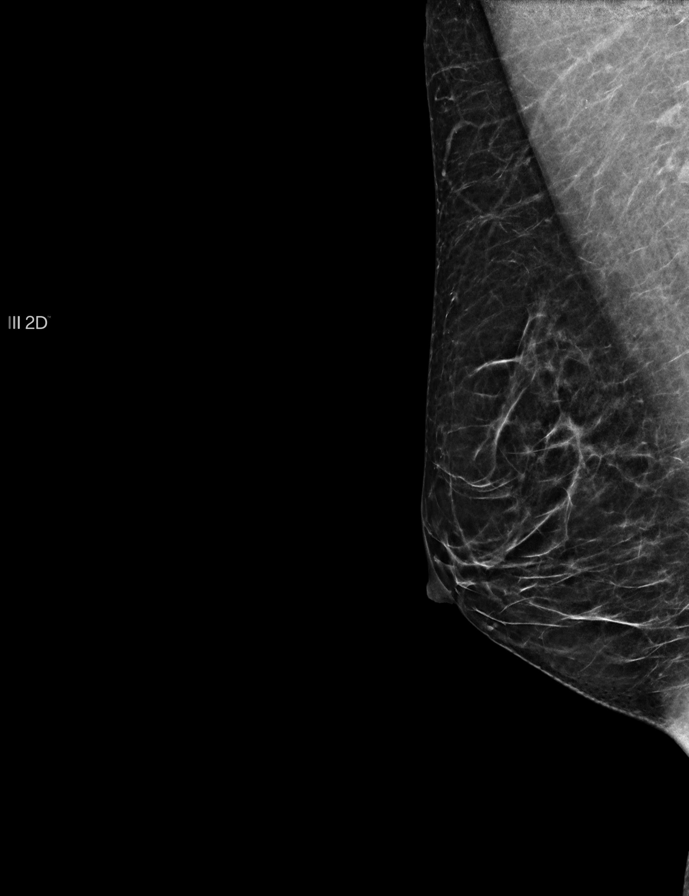

[L MLO]
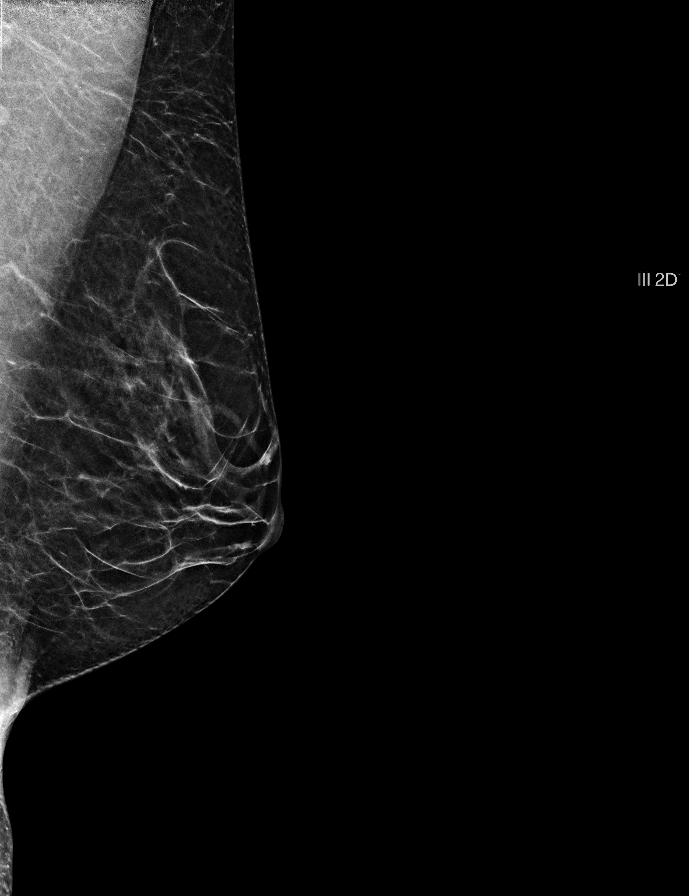

[R CC tomo · 2 of 52 frames shown]
[frame 17/52]
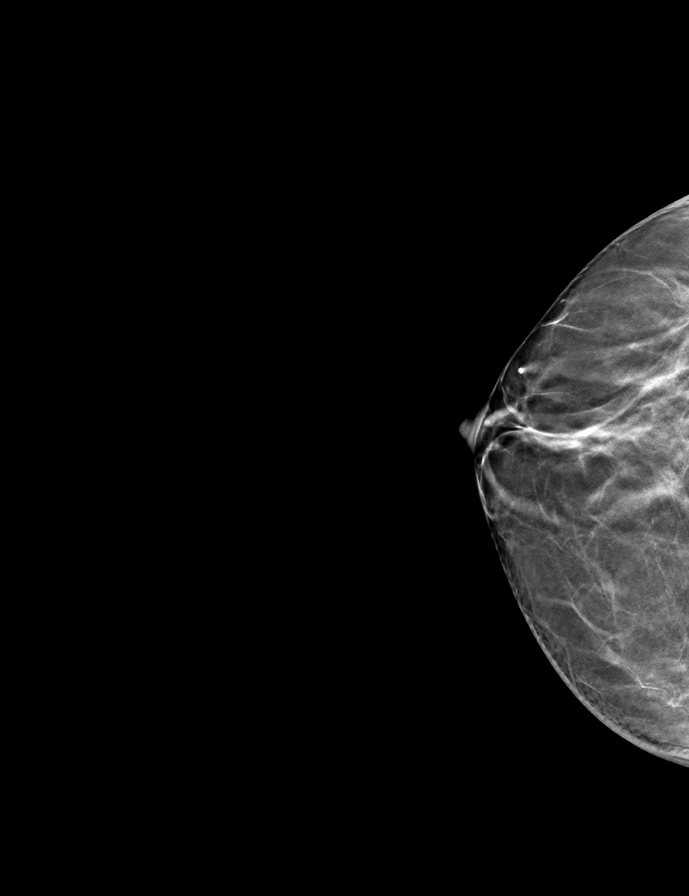
[frame 27/52]
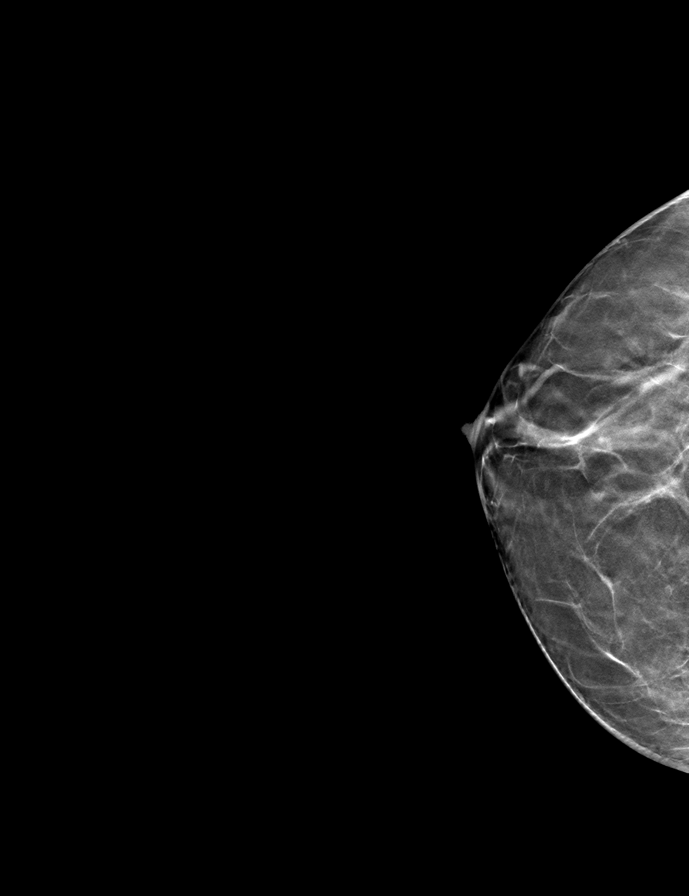

[L MLO tomo · tomo slice 27/52.0]
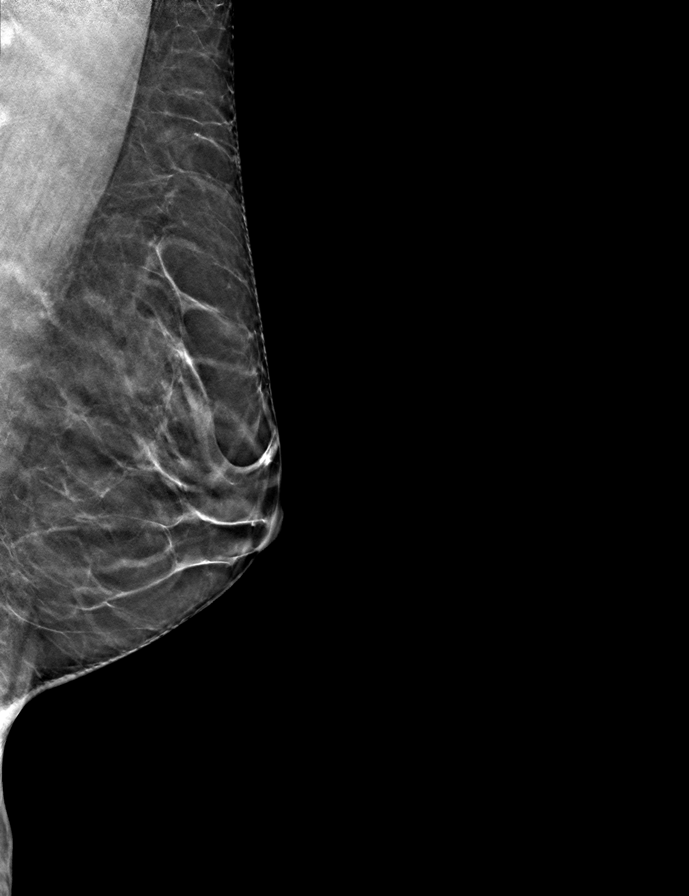

[R MLO tomo · tomo slice 26/51.0]
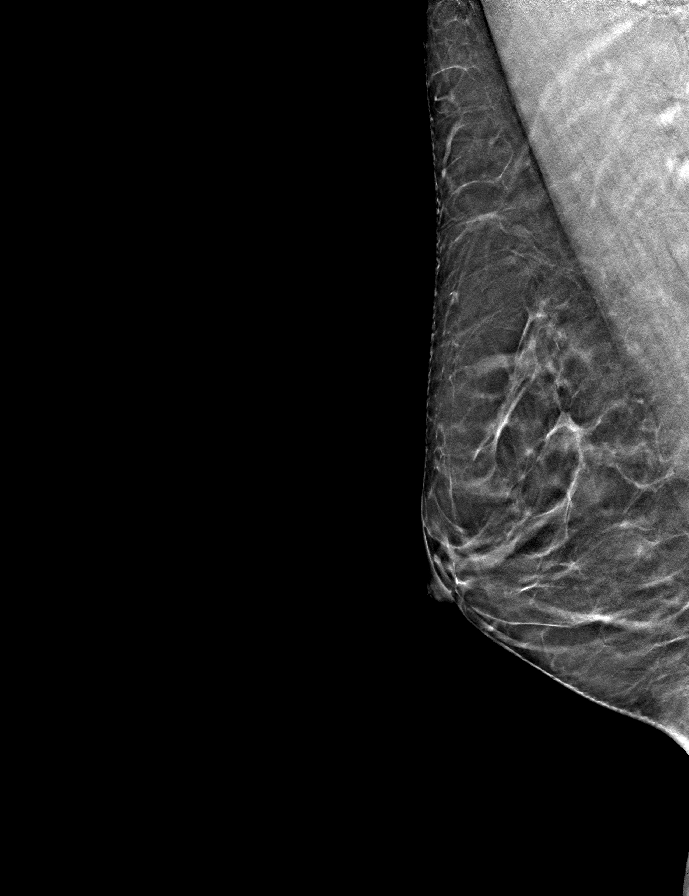

[L CC tomo · tomo slice 29/56.0]
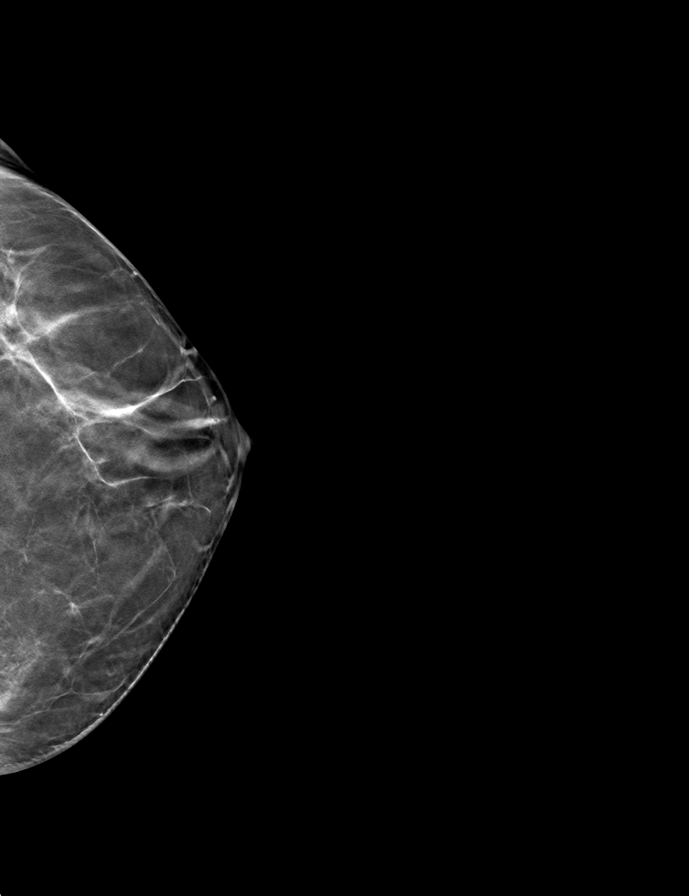

[9 of 24 positions shown; findings below may reference images not displayed]

FINDINGS: Benign calcification. No suspicious mass, calcifications, or area of 
architectural distortion in either breast.
IMPRESSION: Stable mammogram. 
(BI-RADS 2) Benign findings. Routine mammographic follow-up is recommended.

## 2021-07-01 IMAGING — CT CT ABDOMEN AND PELVIS WITH IV AND ORAL CONT
2 of 3 series · 16 of 46 positions shown, 18 images · IV contrast (isovue)
Comparison: There are no prior exam(s) available for comparison within the past 
12 months;

________________________________________________________________________________________________ 
CT ABDOMEN AND PELVIS WITH IV AND ORAL CONT, 07/01/2021 [DATE]: 
CLINICAL INDICATION: Generalized abdominal pain  
A search for DICOM formatted images was conducted for prior CT imaging studies 
completed at a non-affiliated media free facility.
TECHNIQUE: The abdomen and pelvis were scanned from lung bases through the 
pubic rami with 100 mL of Isovue 300 contrast on a high-resolution CT scanner 
using dose reduction techniques. Patient drank diluted oral contrast for this 
study. Routine MPR reconstructions were performed.

[Series 4: axial · axial · 0.67mm/px · z∈[+610,+988]mm · 13 of 146 slices shown, 15 images]
[im 10/146  soft-tissue]
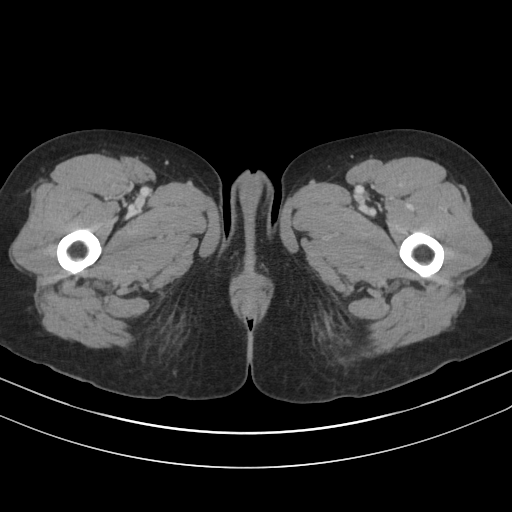
[im 10/146  bone]
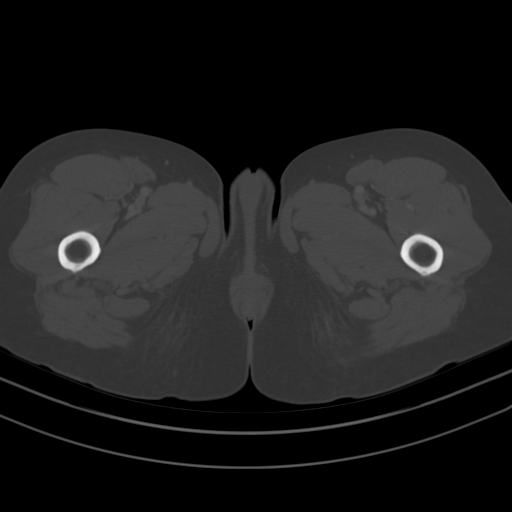
[im 19/146  soft-tissue]
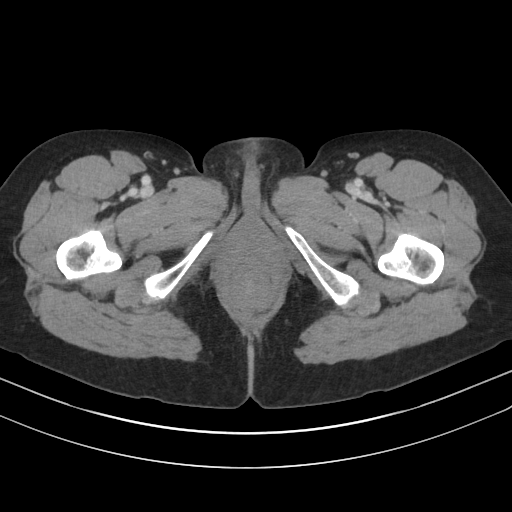
[im 29/146  soft-tissue]
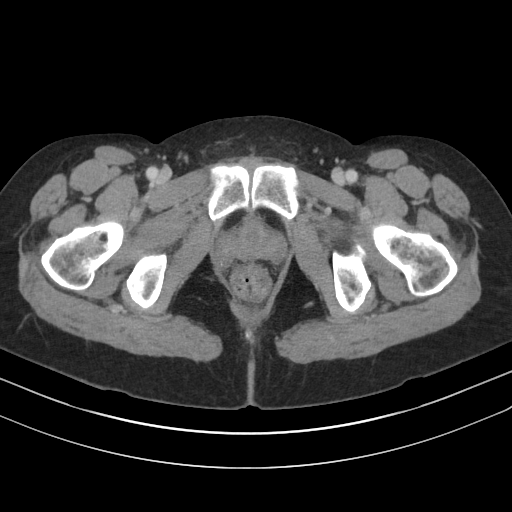
[im 43/146  soft-tissue]
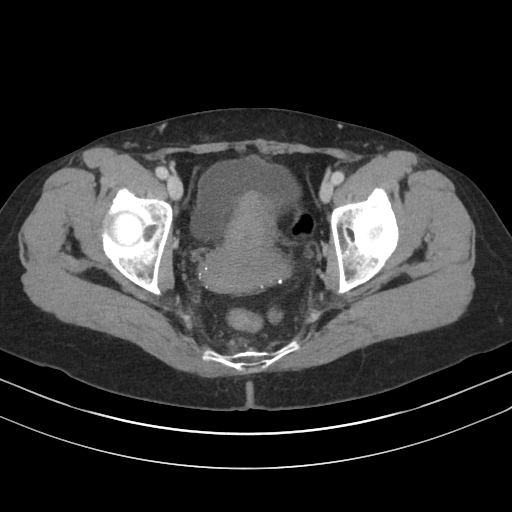
[im 52/146  soft-tissue]
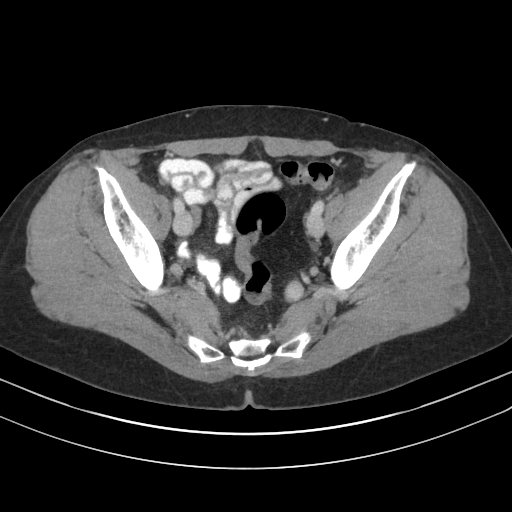
[im 61/146  soft-tissue]
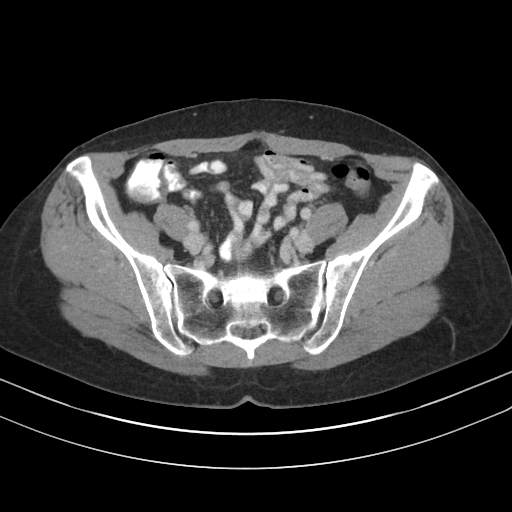
[im 75/146  soft-tissue]
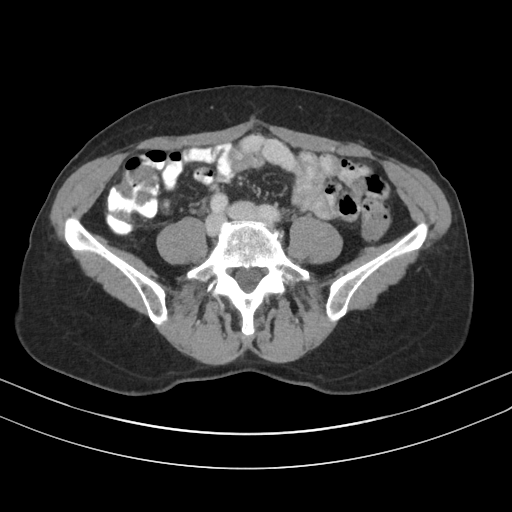
[im 85/146  soft-tissue]
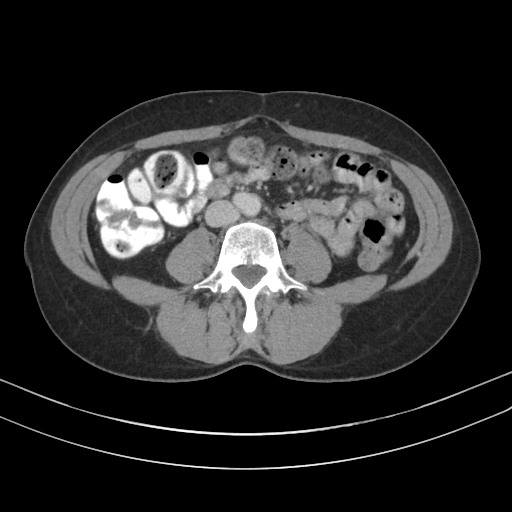
[im 94/146  soft-tissue]
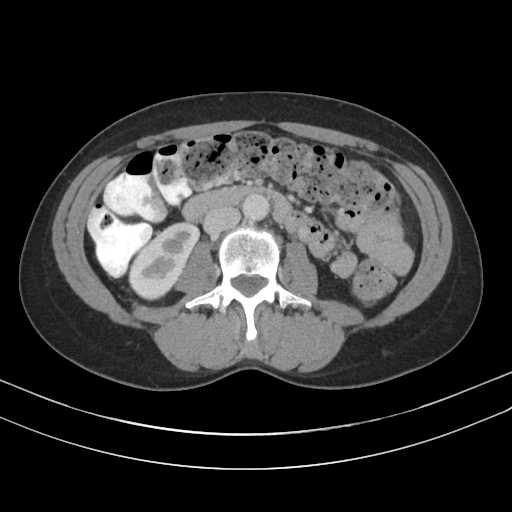
[im 94/146  bone]
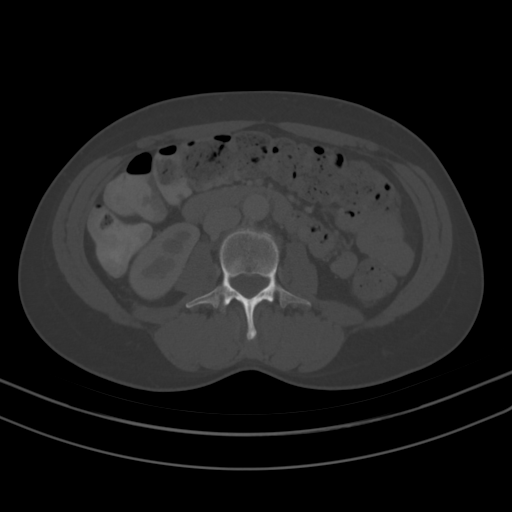
[im 103/146  soft-tissue]
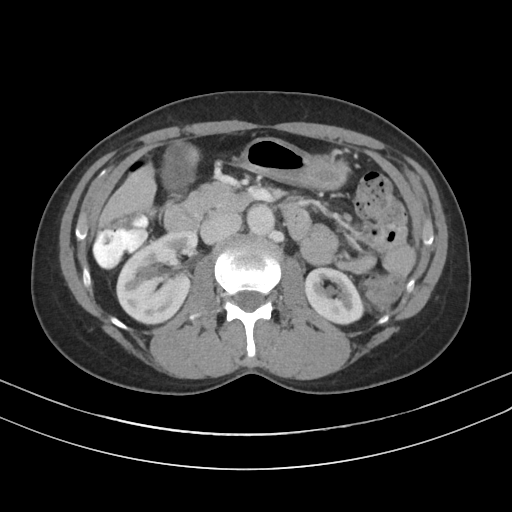
[im 117/146  soft-tissue]
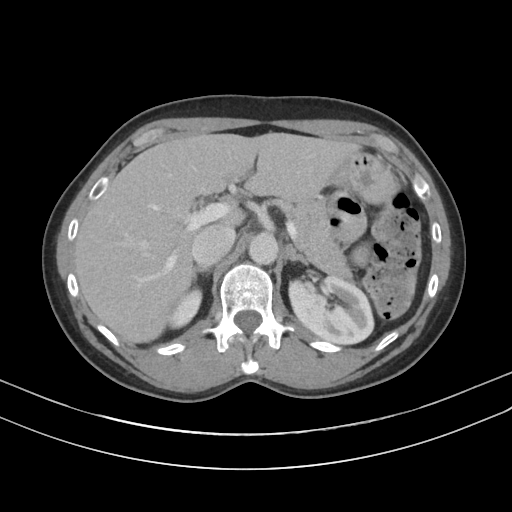
[im 127/146  soft-tissue]
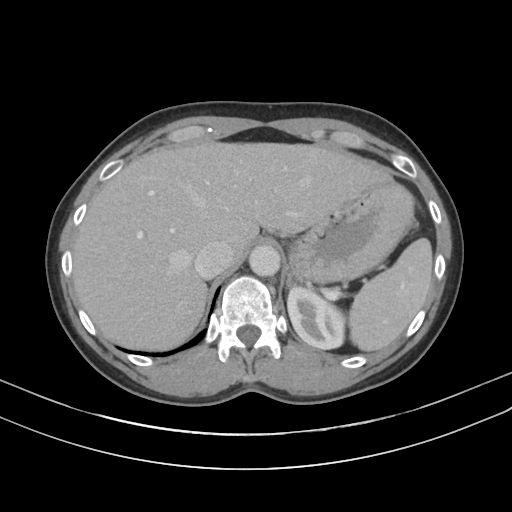
[im 136/146  soft-tissue]
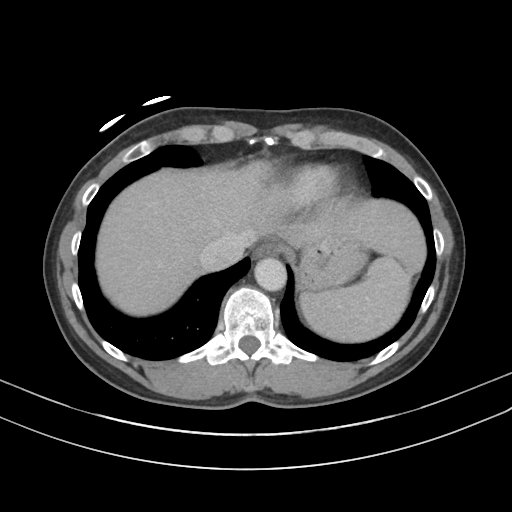

[Series 5: cor · coronal · 0.70mm/px · 3 of 106 slices shown]
[im 36/106  soft-tissue]
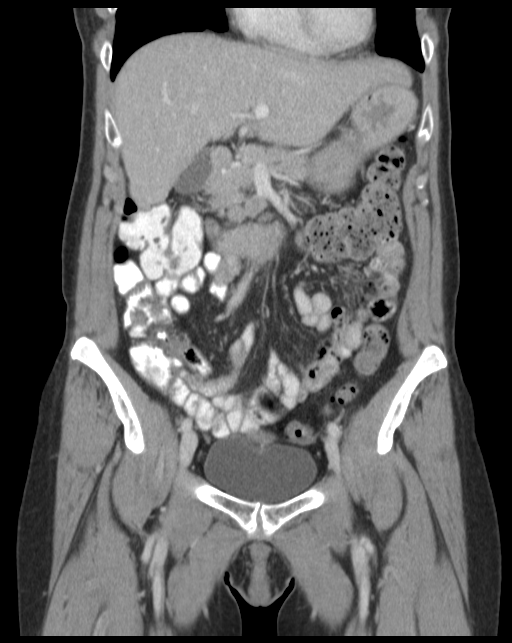
[im 47/106  soft-tissue]
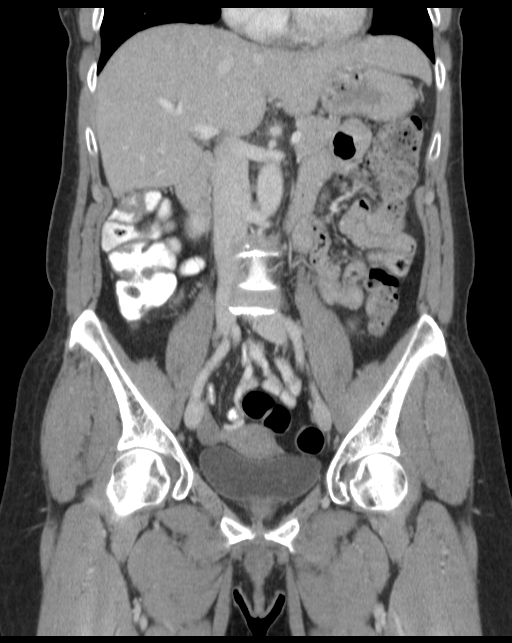
[im 59/106  soft-tissue]
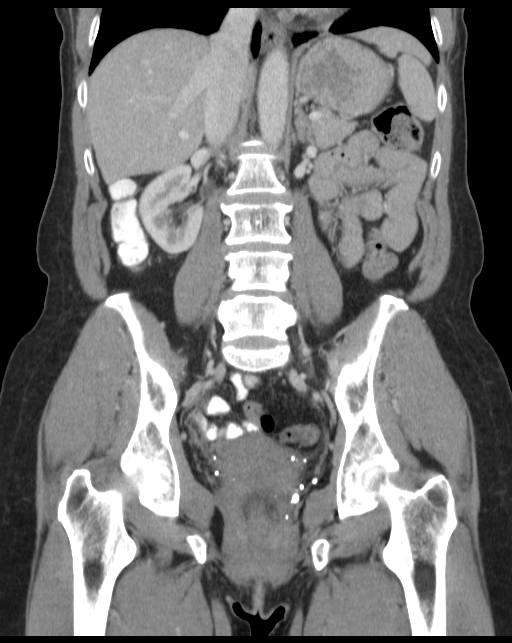

[16 of 46 positions shown; findings below may reference images not displayed]

however, comparison was made to the prior exam(s) 12/09/2016 CT chest 
with imaging through the upper abdomen .
FINDINGS: LUNG BASES: Lung bases are clear. No pleural effusions. 
HEPATOBILIARY: Images show no mass or biliary dilatation. No gallstones. Stable 
distal gallbladder wall thickening, consistent with adenomyomatosis. 
SPLEEN: Normal in size. 
PANCREAS: No evidence for ductal dilatation or mass.   
ADRENALS: No mass. 
GENITOURINARY: No hydronephrosis or kidney stones. Bladder is unremarkable. 
LYMPH NODES: No adenopathy. 
STOMACH, SMALL BOWEL AND COLON: No bowel wall thickening or obstruction. 
Appendectomy. 
VASCULAR STRUCTURES: Retroaortic left renal vein. No aneurysm.  
MUSCULOSKELETAL: No acute osseous abnormality. Mild degenerative changes.
IMPRESSION: 1.  Stable distal gallbladder wall thickening, consistent with adenomyomatosis. 
2.  Appendectomy. 
3.  Mild degenerative changes.  
RADIATION DOSE REDUCTION: All CT scans are performed using radiation dose 
reduction techniques, when applicable.  Technical factors are evaluated and 
adjusted to ensure appropriate moderation of exposure.  Automated dose 
management technology is applied to adjust the radiation doses to minimize 
exposure while achieving diagnostic quality images.

## 2022-03-31 IMAGING — MG MAMMOGRAPHY SCREENING BILATERAL 3[PERSON_NAME]
8 series · 9 of 24 positions shown · non-contrast
Comparison: 03/16/2021 and dating back to 12/22/2016

________________________________________________________________________________________________ 
MAMMOGRAPHY SCREENING BILATERAL 3MANJINDER KAUFMANN, 03/31/2022 [DATE]: 
CLINICAL INDICATION: Encounter for screening mammogram.
TECHNIQUE: Digital bilateral mammograms and 3-D Tomosynthesis were obtained. 
These were interpreted both primarily and with the aid of computer-aided 
detection system.  
BREAST DENSITY: (Level B) There are scattered areas of fibroglandular density.

[L MLO]
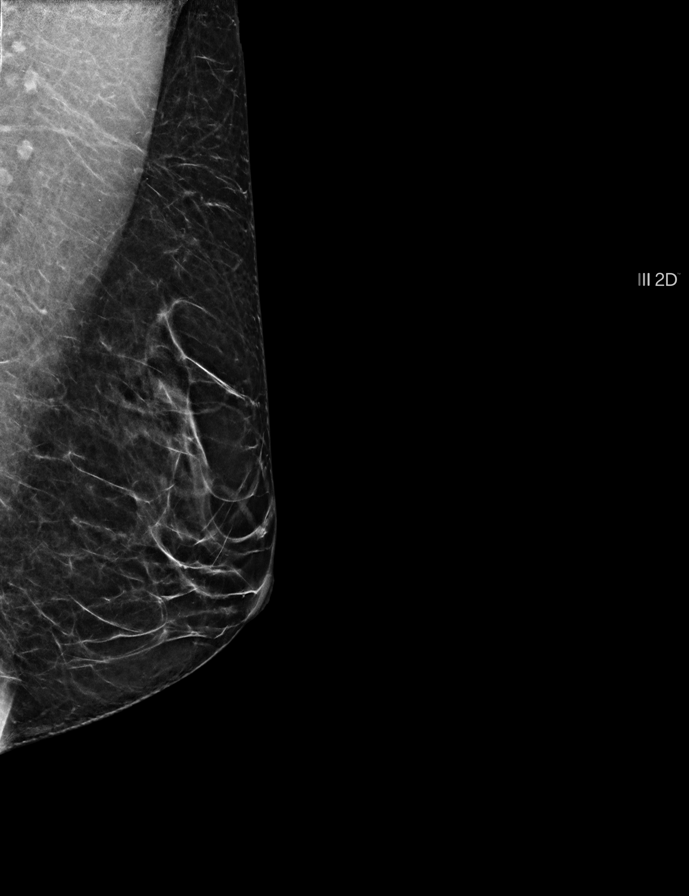

[R CC]
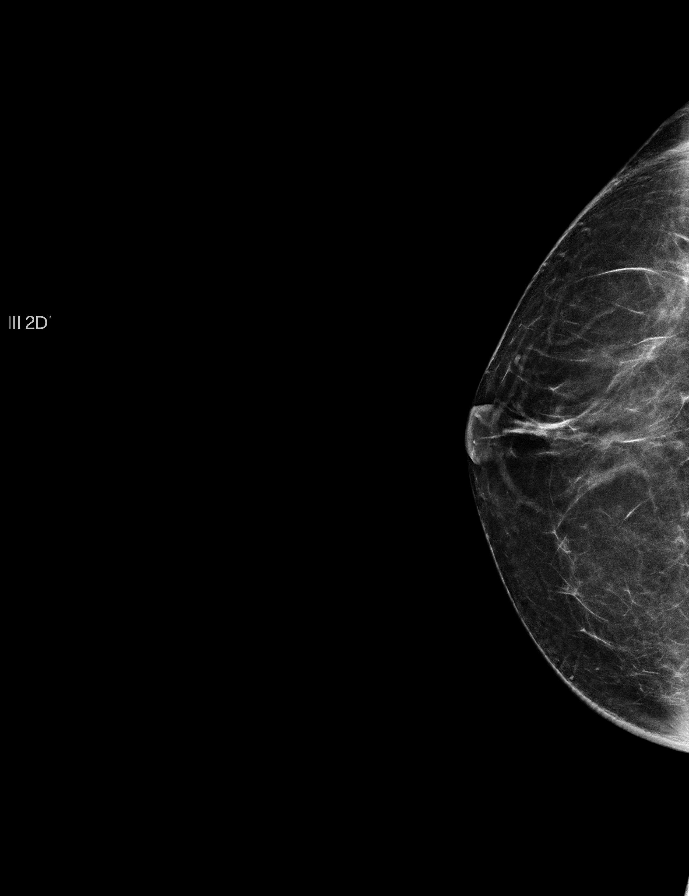

[L CC]
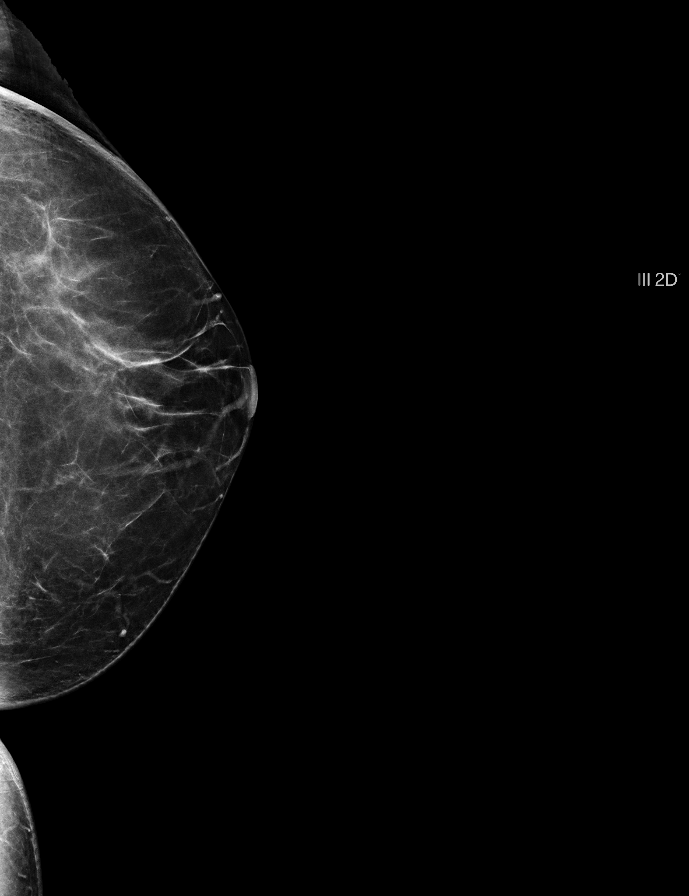

[R MLO]
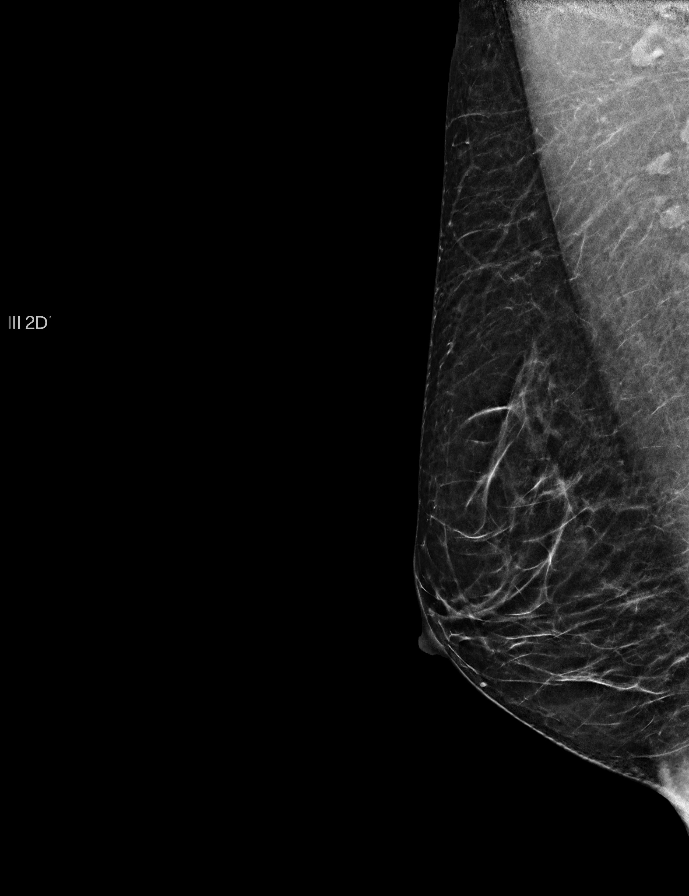

[R MLO tomo · 2 of 55 frames shown]
[frame 18/55]
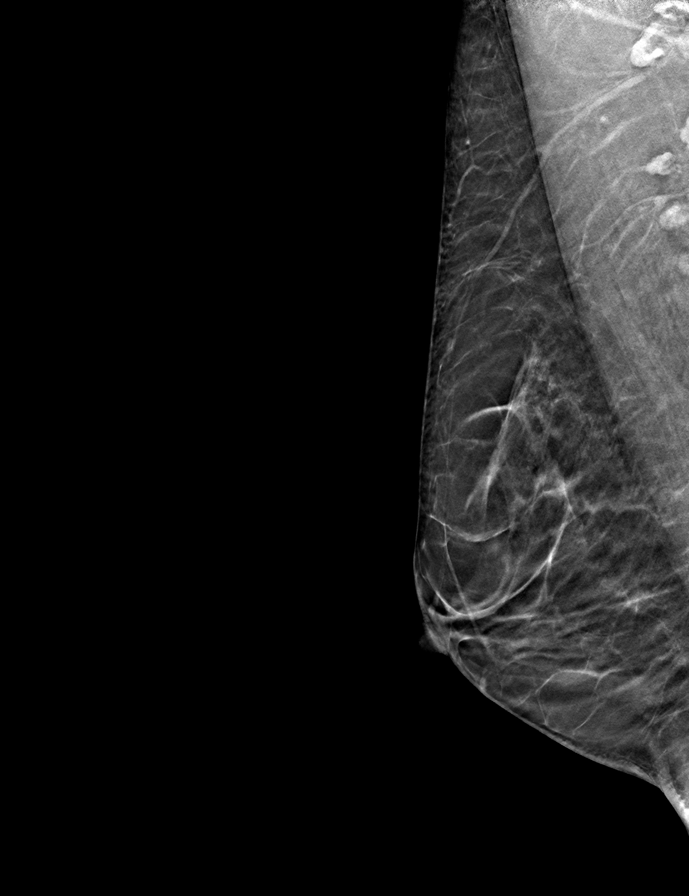
[frame 28/55]
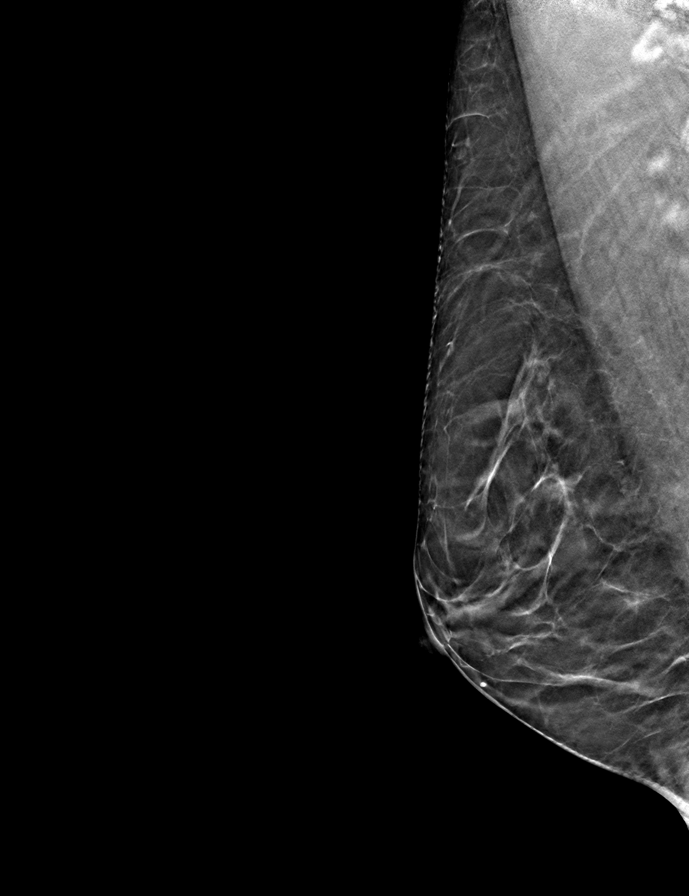

[L CC tomo · tomo slice 33/66.0]
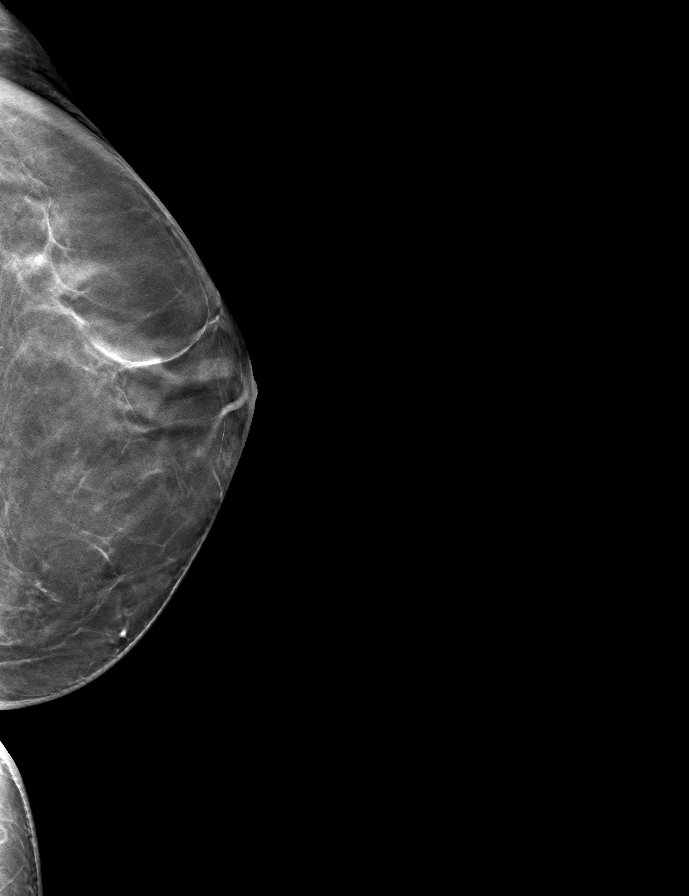

[L MLO tomo · tomo slice 28/55.0]
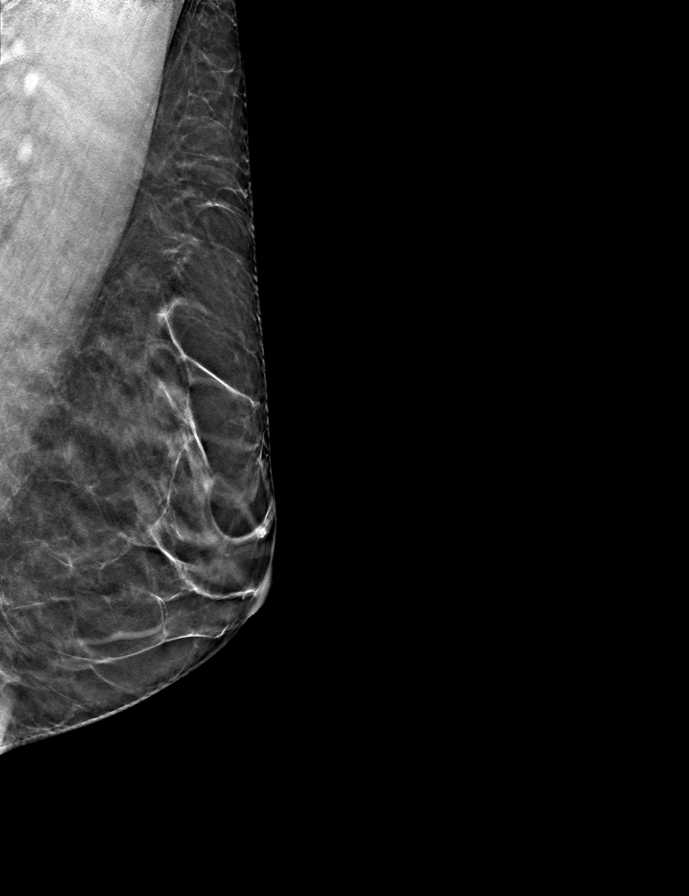

[R CC tomo · tomo slice 32/63.0]
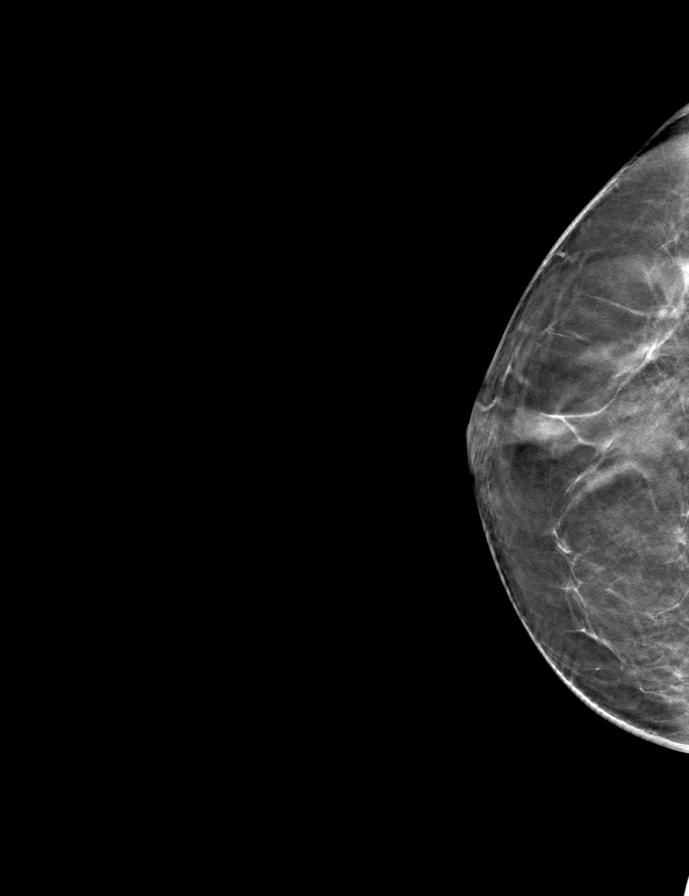

[9 of 24 positions shown; findings below may reference images not displayed]

FINDINGS: No suspicious mass, calcifications, or area of architectural 
distortion in either breast. Overall stable mammographic appearance.
IMPRESSION: No mammographic findings suggestive for malignancy. 
(BI-RADS 2) Benign findings. Routine mammographic follow-up is recommended.

## 2022-05-27 IMAGING — MR MRI ABDOMEN W/WO CONTRAST WITH MRCP
17 of 21 series · 32 of 48 positions shown · IV contrast (gadolinium)
Comparison: CT scan from June 2021

________________________________________________________________________________________________ 
MRI ABDOMEN W/WO CONTRAST WITH MRCP, 05/27/2022 [DATE]: 
CLINICAL INDICATION: Follow-up liver hemangioma.
TECHNIQUE: Multiplanar, multi acquisition MR images of the abdomen were 
performed without and with intravenous gadolinium enhancement including dynamic 
imaging.  3D MRCP sequences were performed with post processing.  5.5 mL of 
Gadavist were injected intravenously by hand. 2.0 mL of Gadavist were discarded. 
 Patient was scanned on a 1.5T magnet.

[Series 101: survey-head 1st · axial · 15.0mm · 1.76mm/px · 1 of 15 slices shown]
[im 1/15]
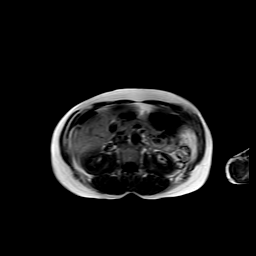

[Series 201: T2 · coronal · 5.0mm · 0.88mm/px · 1 of 30 slices shown]
[im 1/30]
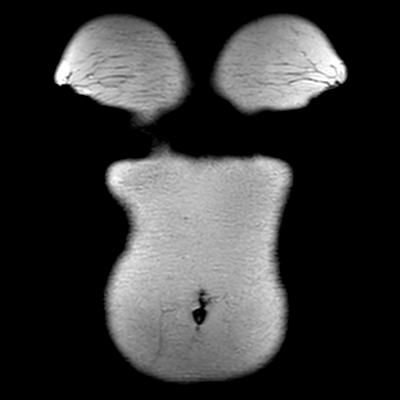

[Series 302: sout of phase · axial · 6.0mm · 1.09mm/px · 1 of 31 slices shown]
[im 1/31]
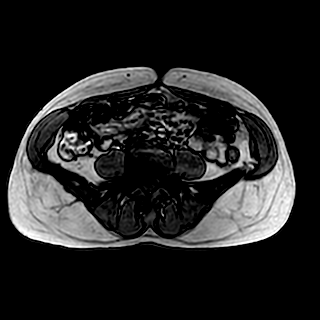

[Series 303: sin phase · axial · 6.0mm · 1.09mm/px · 1 of 31 slices shown]
[im 1/31]
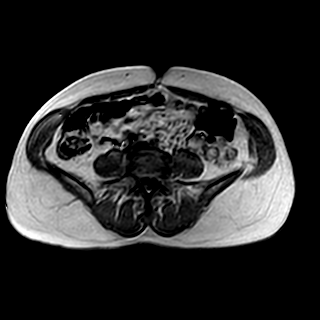

[Series 401: t2_ax_mvxd_hr_rt · axial · 5.0mm · 0.78mm/px · 1 of 35 slices shown]
[im 1/35]
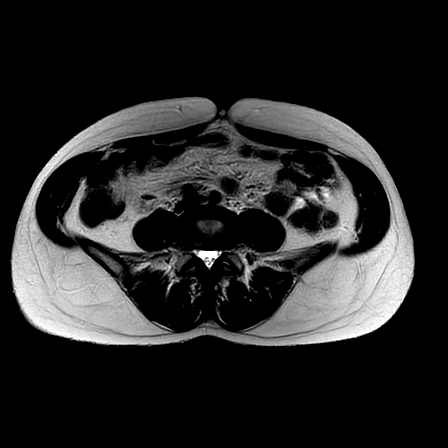

[Series 501: t2_spair mvxd_rt_fast · axial · 5.0mm · 0.81mm/px · 1 of 35 slices shown]
[im 1/35]
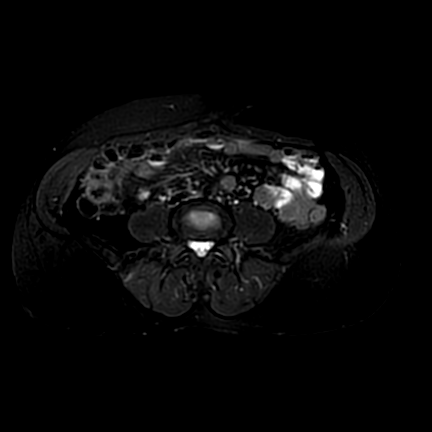

[Series 602: sbo · axial · 5.0mm · 1.67mm/px · 1 of 35 slices shown]
[im 1/35]
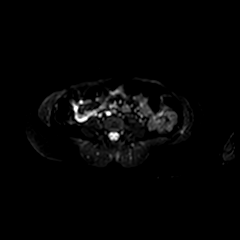

[Series 603: (id) · axial · 5.0mm · 1.67mm/px · 1 of 35 slices shown]
[im 1/35]
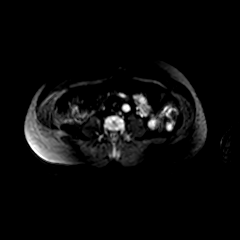

[Series 604: dadc 600 · axial · 5.0mm · 1.67mm/px · 1 of 35 slices shown]
[im 1/35]
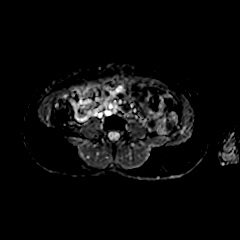

[Series 701: 3d_grase_bh · coronal · 3.0mm · 0.78mm/px · 1 of 58 slices shown]
[im 1/58]
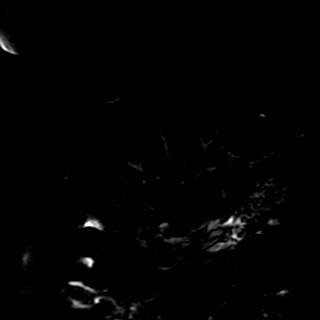

[Series 801: ssh_mrcp_radial_bh · coronal · 40.0mm · 0.59mm/px · 1 of 3 slices shown]
[im 1/3]
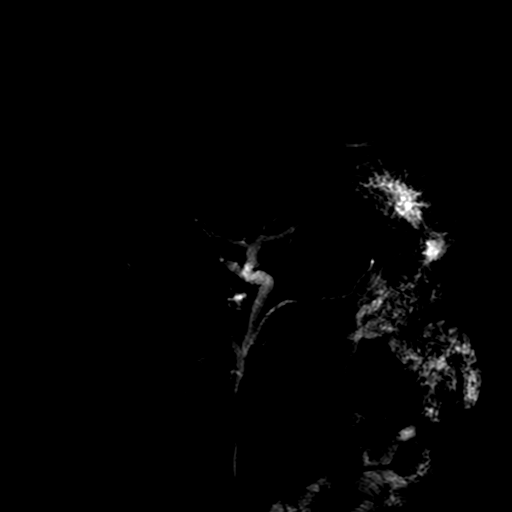

[Series 902: DIXON · axial · 4.0mm · 0.88mm/px · z∈[-140,+98]mm · 3 of 120 slices shown (1 of 5)]
[im 1/120]
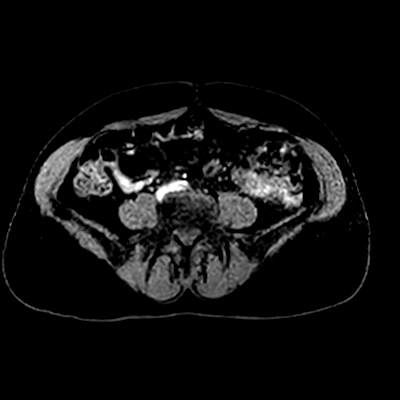
[im 60/120]
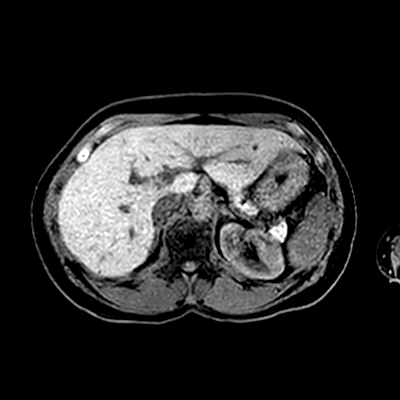
[im 120/120]
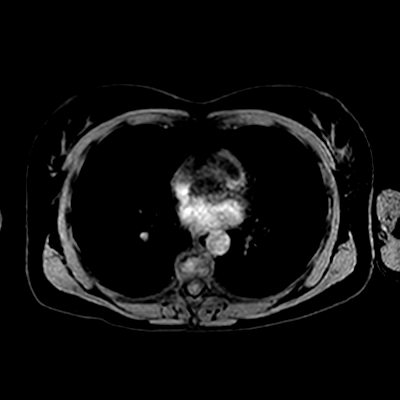

[Series 903: DIXON · axial · 4.0mm · 0.88mm/px · z∈[-140,+98]mm · 3 of 120 slices shown (2 of 5)]
[im 1/120]
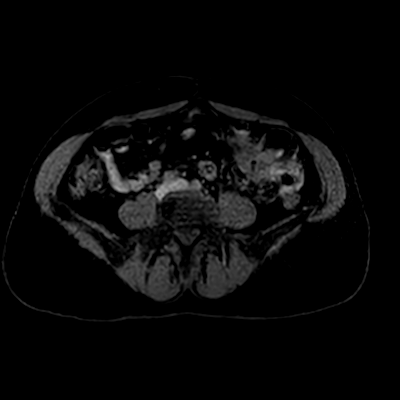
[im 60/120]
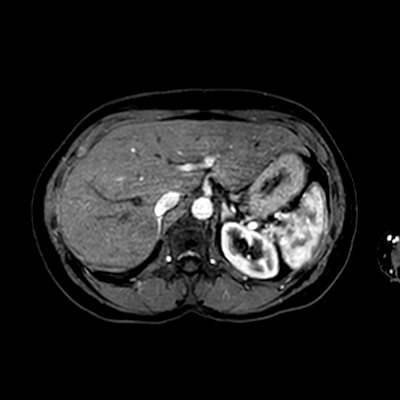
[im 120/120]
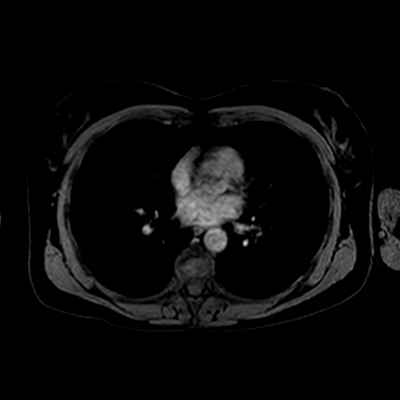

[Series 904: DIXON · axial · 4.0mm · 0.88mm/px · z∈[-140,+98]mm · 3 of 120 slices shown (3 of 5)]
[im 1/120]
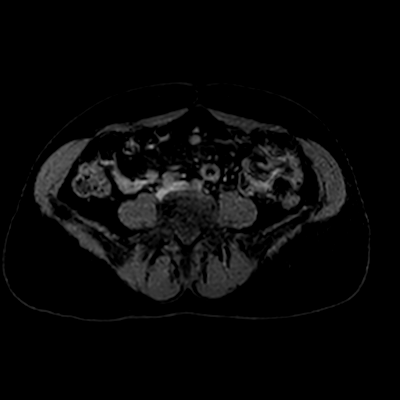
[im 60/120]
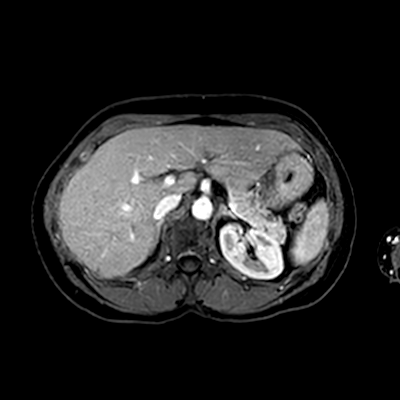
[im 120/120]
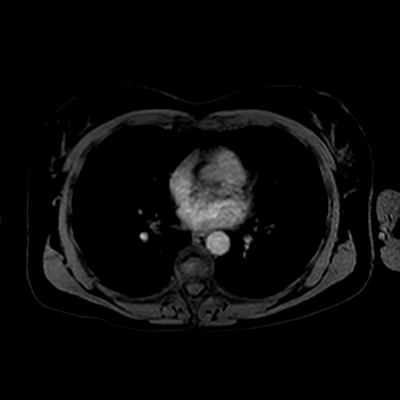

[Series 905: DIXON · axial · 4.0mm · 0.88mm/px · z∈[-140,+98]mm · 4 of 120 slices shown (4 of 5)]
[im 1/120]
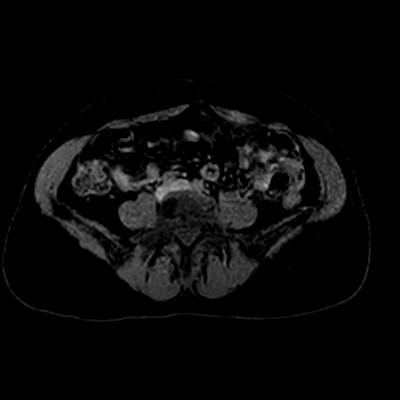
[im 40/120]
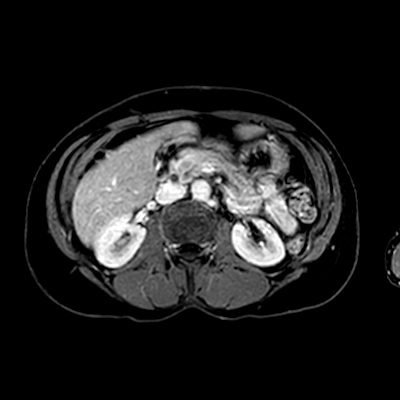
[im 80/120]
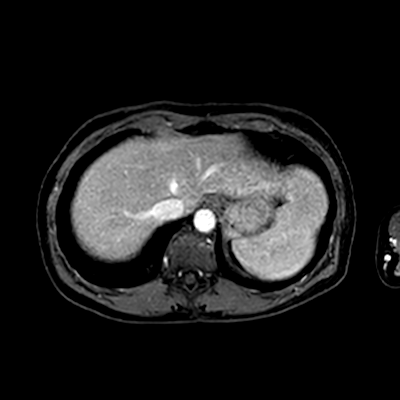
[im 120/120]
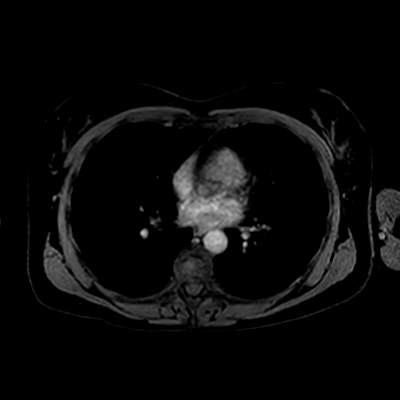

[Series 906: DIXON · axial · 4.0mm · 0.88mm/px · z∈[-140,+98]mm · 4 of 120 slices shown (5 of 5)]
[im 1/120]
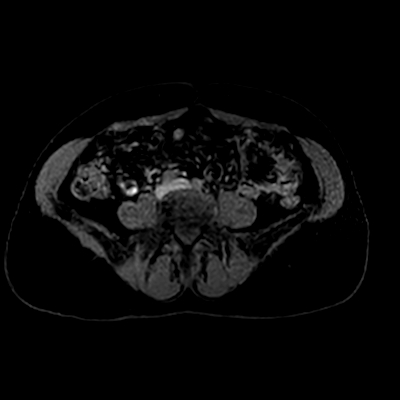
[im 40/120]
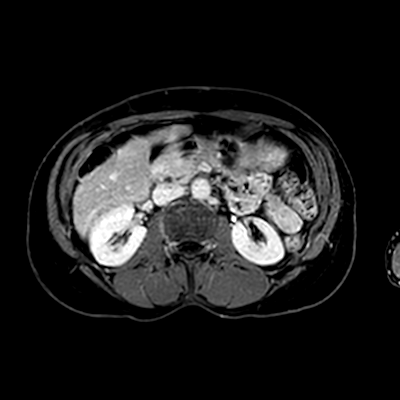
[im 80/120]
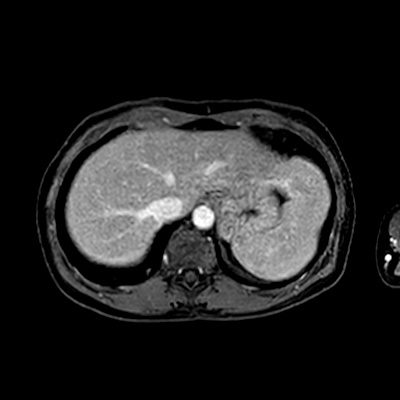
[im 120/120]
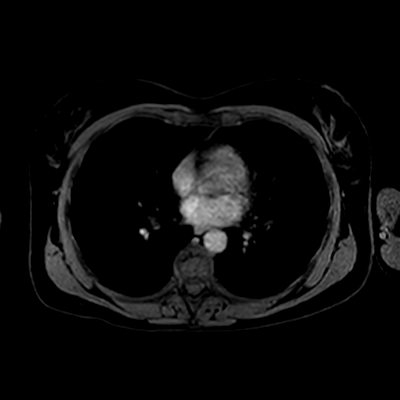

[Series 907: DIXON post-contrast · axial · 4.0mm · 0.88mm/px · z∈[-140,+98]mm · 4 of 120 slices shown]
[im 1/120]
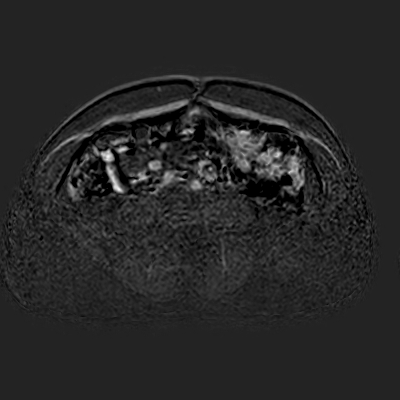
[im 40/120]
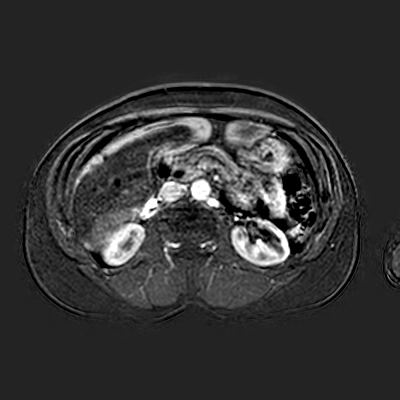
[im 80/120]
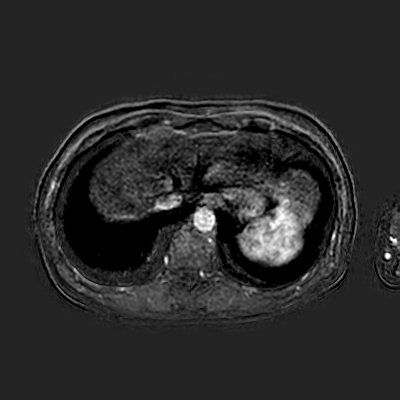
[im 120/120]
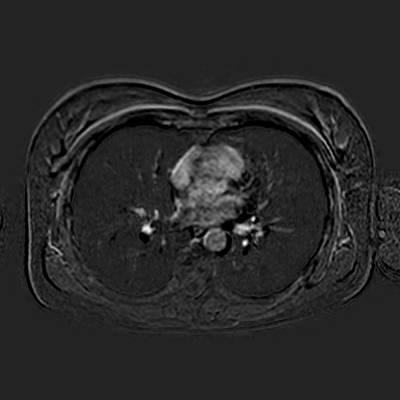

[32 of 48 positions shown; findings below may reference images not displayed]

FINDINGS: There does appear to be a subtle hemangioma far inferior anterior 
aspect right lobe liver in the region of the gallbladder fossa. The gallbladder 
has been removed since the prior exams. This is mildly increased in signal on T2 
axial image 21. Does appear to show mild delayed enhancement on series 2242, 
image 87. Unfortunately this area is difficult evaluate on MRI given the 
respiratory motion and motion of the inferior aspect of the liver. No suspicious 
developing mass identified. Common bile duct measures 7 mm just above the 
pancreatic head. No intrahepatic ductal dilatation or suspicious mass elsewhere. 
Pancreas, spleen, adrenal glands and kidneys are otherwise unremarkable in 
appearance. No suspicious adenopathy. Portal and hepatic veins are widely 
patent.
IMPRESSION: Difficult to visualize though thought to be incidental hemangioma correlating to 
the older reports and older CT scans dating back to 5087 showed a small 
hemangioma adjacent to the gallbladder fossa. 
Interval cholecystectomy compared to recent exams without complication seen. No 
suspicious abnormality identified elsewhere.

## 2023-06-28 IMAGING — MR MRI PELVIS W/WO CONTRAST
4 of 9 series · 13 of 48 positions shown · IV contrast (Gadolinium)
Comparison: MR examination the lumbar spine the same day. CT exam of 06/12/2023.

________________________________________________________________________________________________ 
MRI PELVIS W/WO CONTRAST, 06/28/2023 [DATE]: 
CLINICAL INDICATION: Evaluate L3 sclerotic lesion. Fall one week ago. Sacral 
pain.
TECHNIQUE: Multiplanar, multiecho position MR images of the pelvis were 
performed without and with 5.5 mL of Gadavist were injected intravenously by 
hand. 2 mL of Gadavist discarded.

[Series 901: survey · axial · 10.0mm · 1.25mm/px · z∈[-232,+4]mm · 2 of 11 slices shown]
[im 1/11]
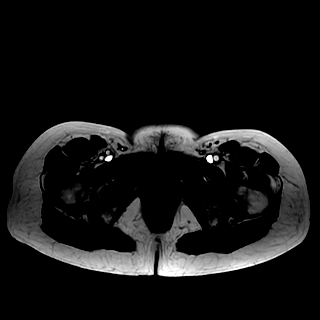
[im 11/11]
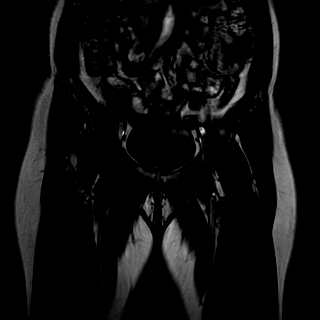

[Series 1001: t1_(person_name) · axial · 6.0mm · 0.44mm/px · z∈[-307,-27]mm · 5 of 36 slices shown]
[im 1/36]
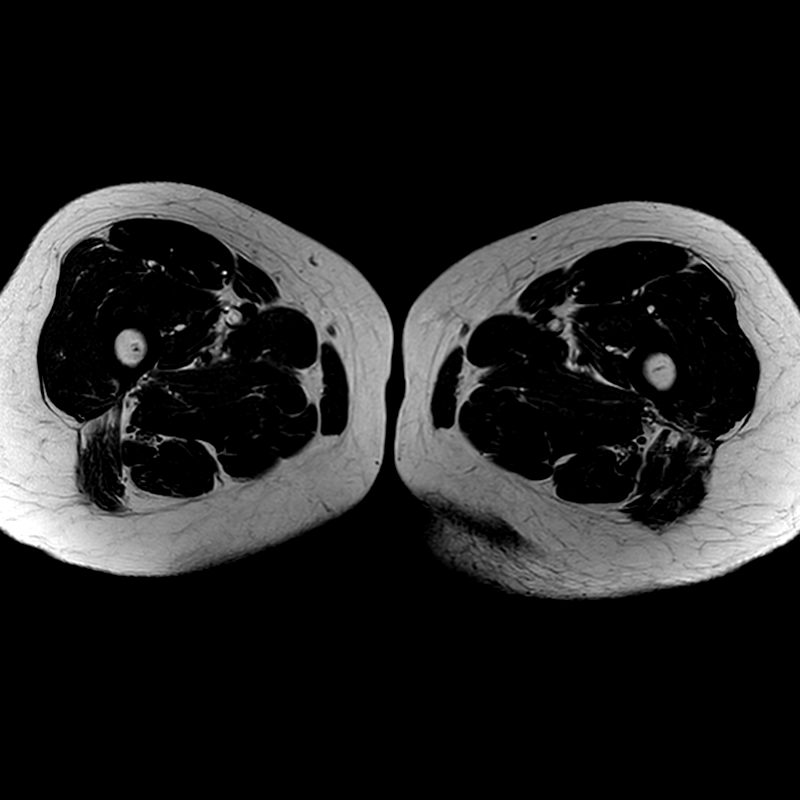
[im 9/36]
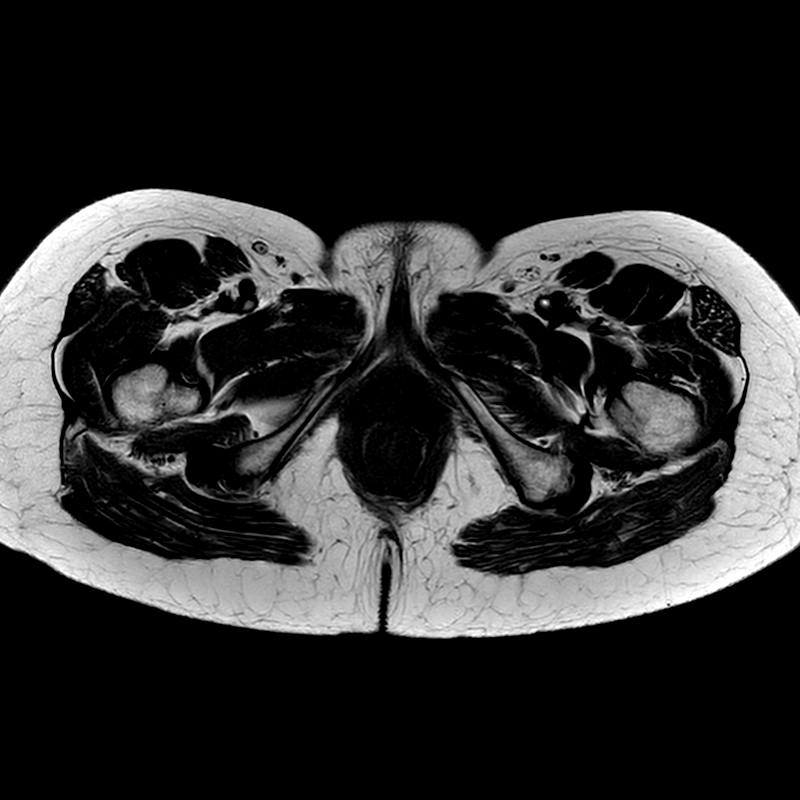
[im 18/36]
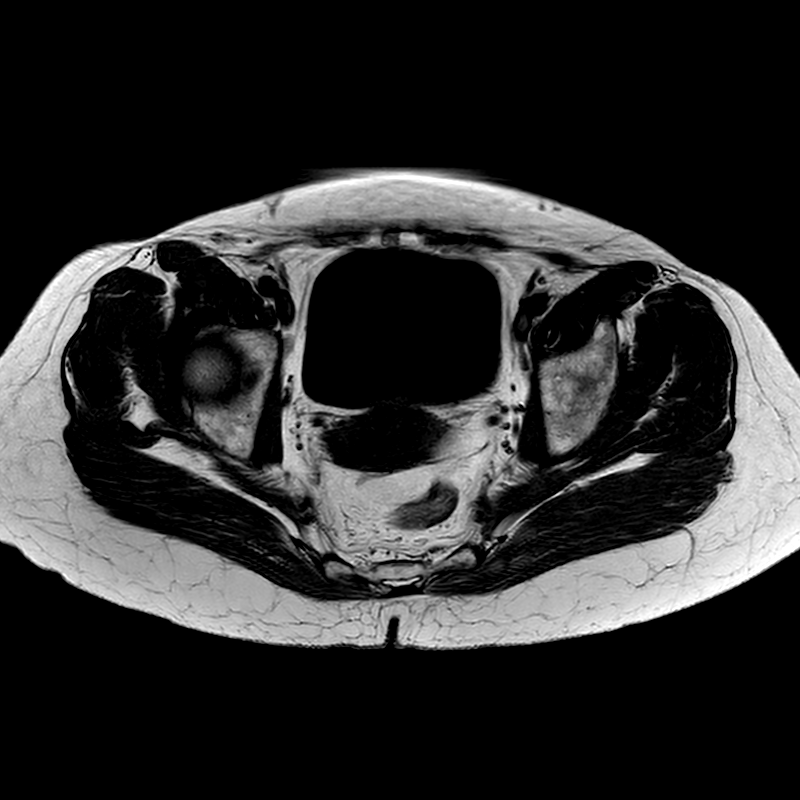
[im 27/36]
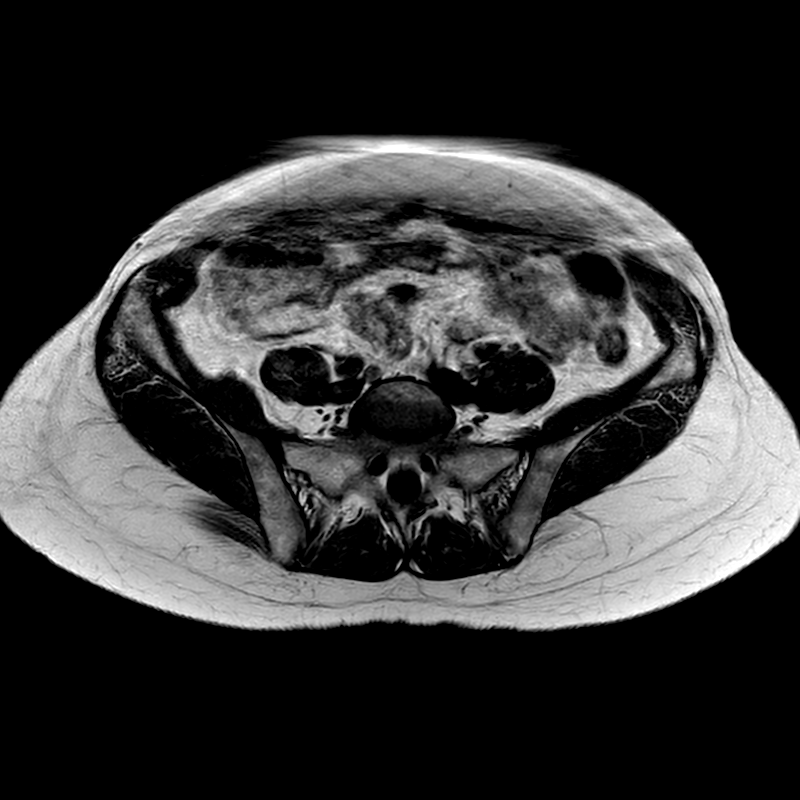
[im 36/36]
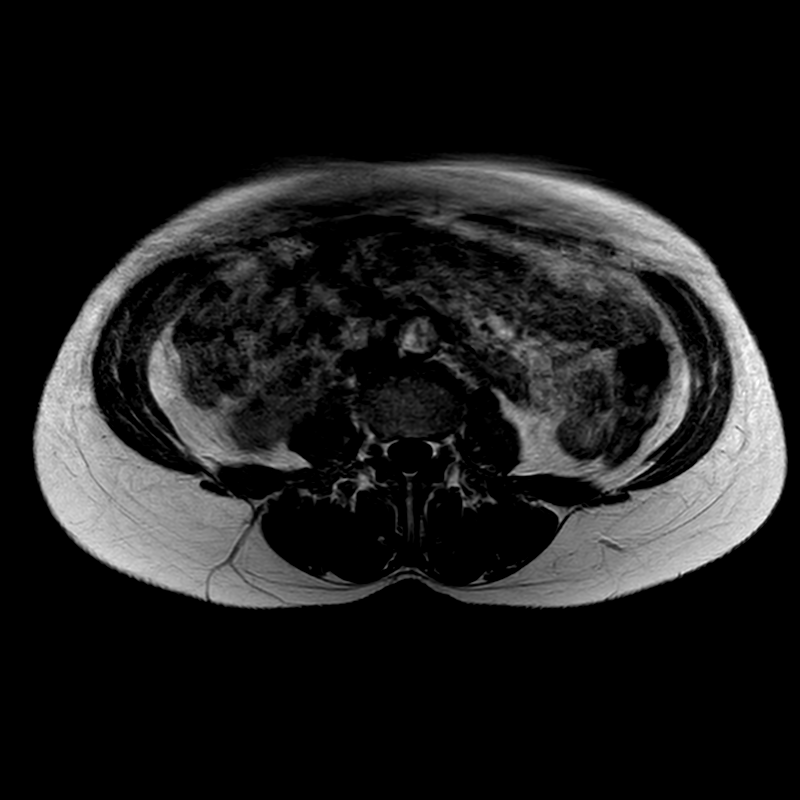

[Series 1101: (person_name)_(person_name)_(person_name) · axial · 6.0mm · 0.68mm/px · z∈[-307,-27]mm · 3 of 36 slices shown]
[im 1/36]
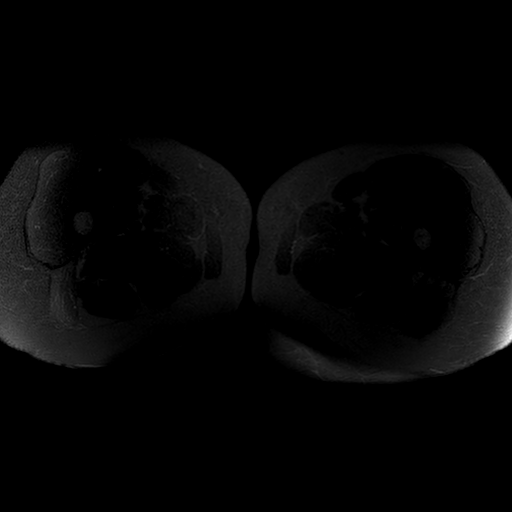
[im 18/36]
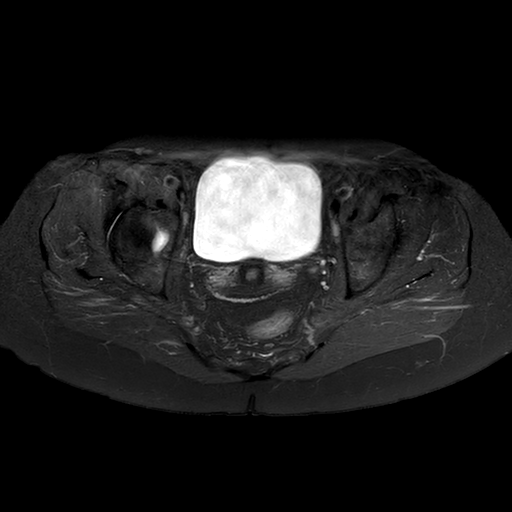
[im 36/36]
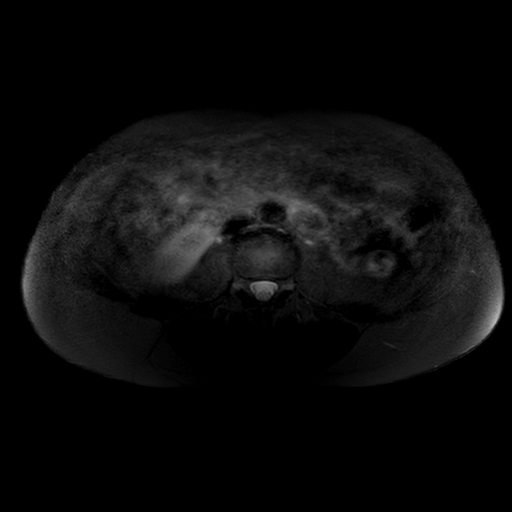

[Series 1201: t1_cor · coronal · 5.0mm · 0.62mm/px · 3 of 30 slices shown]
[im 1/30]
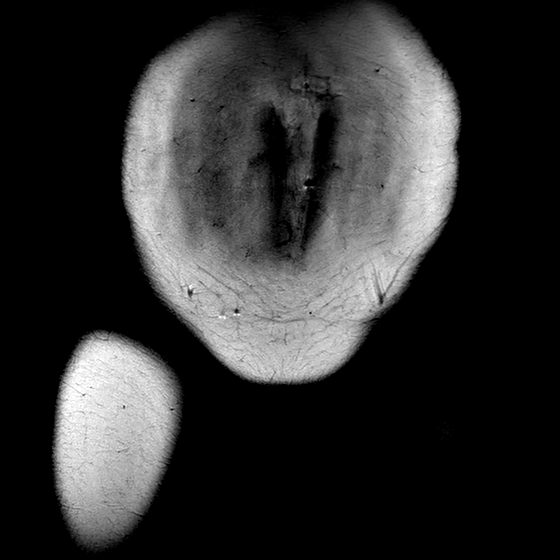
[im 15/30]
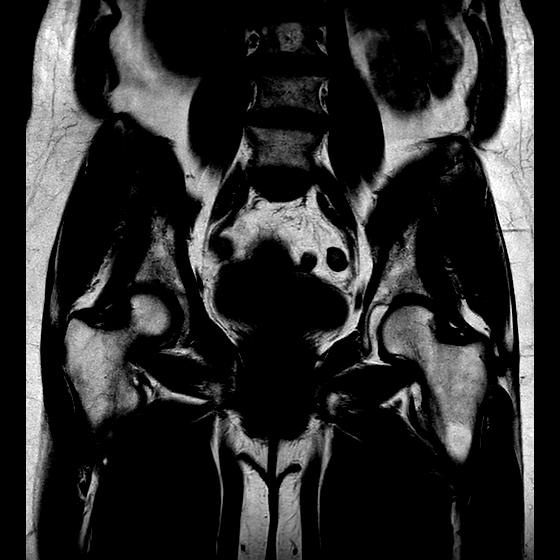
[im 30/30]
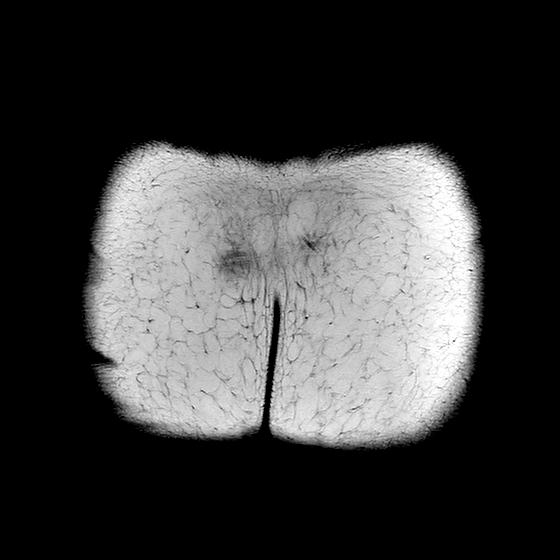

[13 of 48 positions shown; findings below may reference images not displayed]

FINDINGS: BONES: There is acute abnormal edema like signal intensity and enhancement 
involving inferior sacrum with stress fracture. No additional fracture. Normal 
sacrococcygeal alignment. Included portions of the lower lumbar spine are 
negative. Hip joint spaces are preserved. No focal marrow replacing lesion.  
SOFT TISSUES: The bilateral abductor cuffs are preserved. The insertions of the 
iliopsoas tendons are intact. The origins of the hamstrings are preserved. 
Rectus abdominis-adductor complex is preserved. The musculature is symmetric 
without strain, atrophy or mass. No focal fluid collection or distended bursa. 
Specifically, no iliopsoas or trochanteric bursitis. Included neurovascular 
bundles are negative. Intrapelvic contents are negative.
IMPRESSION: Acute sacral stress fracture.

## 2023-06-28 IMAGING — MR MRI LUMBAR SPINE W/WO CONTRAST
6 of 12 series · 13 of 48 positions shown · IV contrast (gadavist)
Comparison: CT lumbar spine June 12, 2023. CT pelvis June 12, 2023. CT abdomen 
pelvis July 01, 2021.

________________________________________________________________________________________________ 
MRI LUMBAR SPINE W/WO CONTRAST, 06/28/2023 [DATE]: 
CLINICAL INDICATION: Malignant Neoplasm Of Vertebral Column , evaluate sclerotic 
lesion L3. Evaluate for metastatic disease. Abnormal CT study. Trauma one week 
ago. Sacral pain.
TECHNIQUE: Multiplanar, multiecho position MR images of the lumbar spine were 
performed without and with 5.5 mL of Gadavist were injected intravenously by 
hand. 2 mL of Gadavist discarded.

[Series 101: survey · axial · 10.0mm · 1.25mm/px · z∈[-33,+201]mm · 2 of 10 slices shown]
[im 1/10]
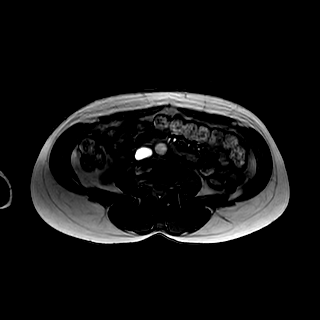
[im 10/10]
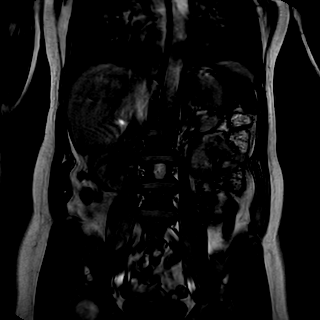

[Series 201: t2w_cor-surv · coronal · 6.0mm · 0.62mm/px · 2 of 10 slices shown]
[im 1/10]
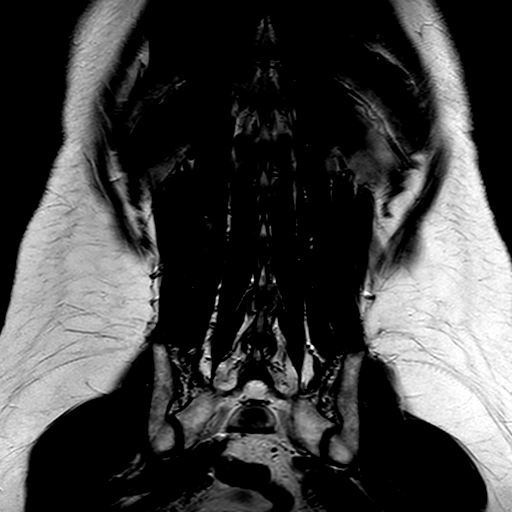
[im 10/10]
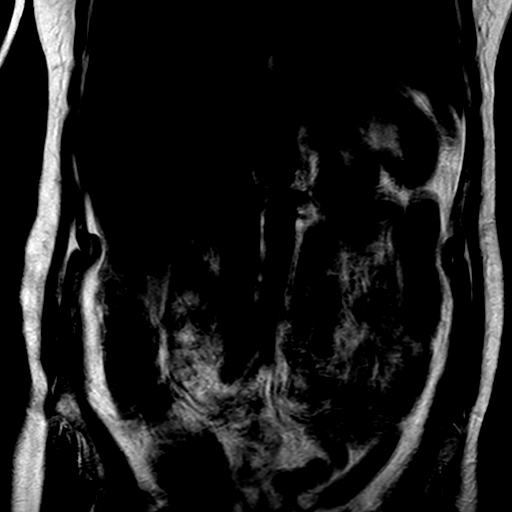

[Series 301: t1_tse_sag · sagittal · 4.0mm · 0.43mm/px · 3 of 19 slices shown]
[im 1/19]
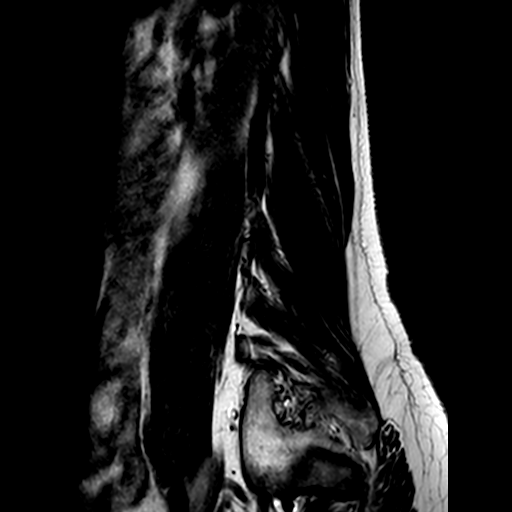
[im 10/19]
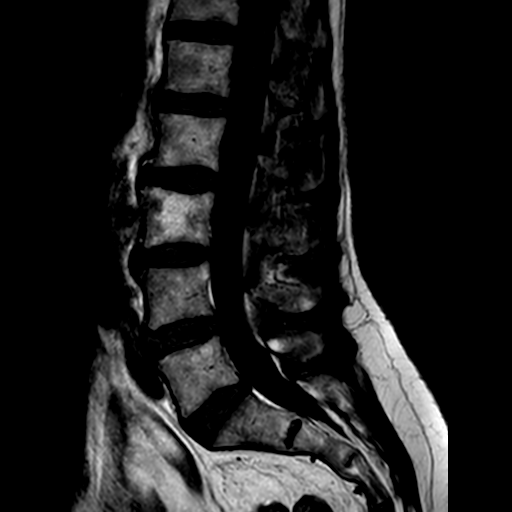
[im 19/19]
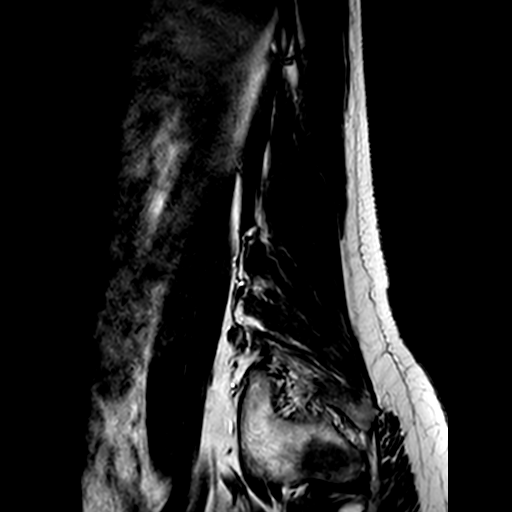

[Series 402: (id)_mdixon_tse · sagittal · 4.0mm · 0.52mm/px · 2 of 19 slices shown]
[im 1/19]
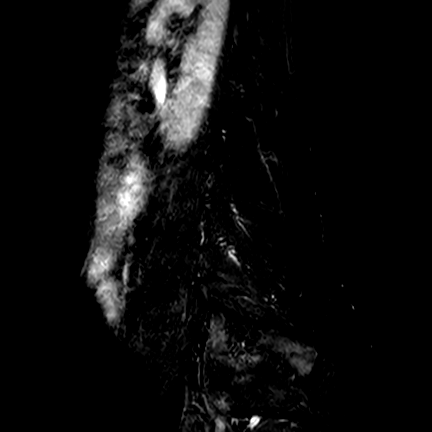
[im 19/19]
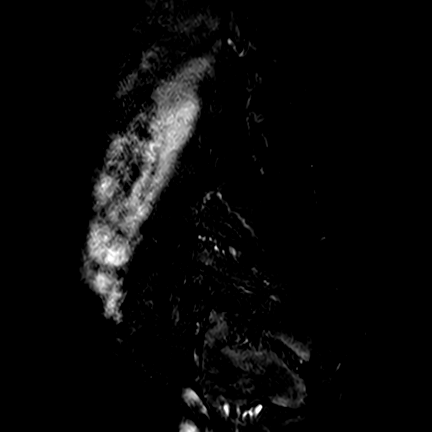

[Series 403: st2w_mdixon_tse · sagittal · 4.0mm · 0.52mm/px · 2 of 19 slices shown]
[im 1/19]
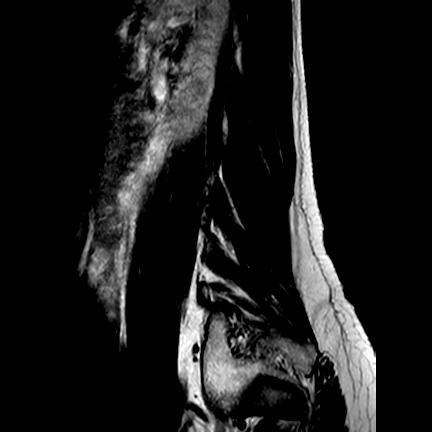
[im 19/19]
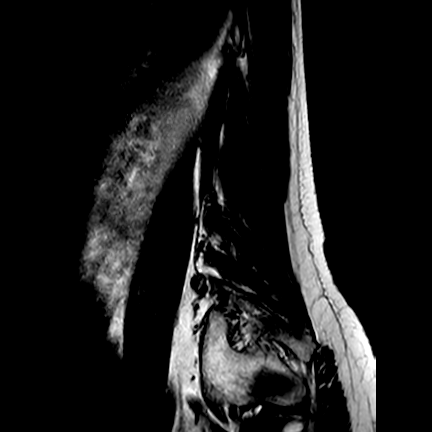

[Series 502: (id) view_ax mpr · axial · 1.0mm · 0.25mm/px · z∈[-119,-84]mm · 2 of 99 slices shown]
[im 1/99]
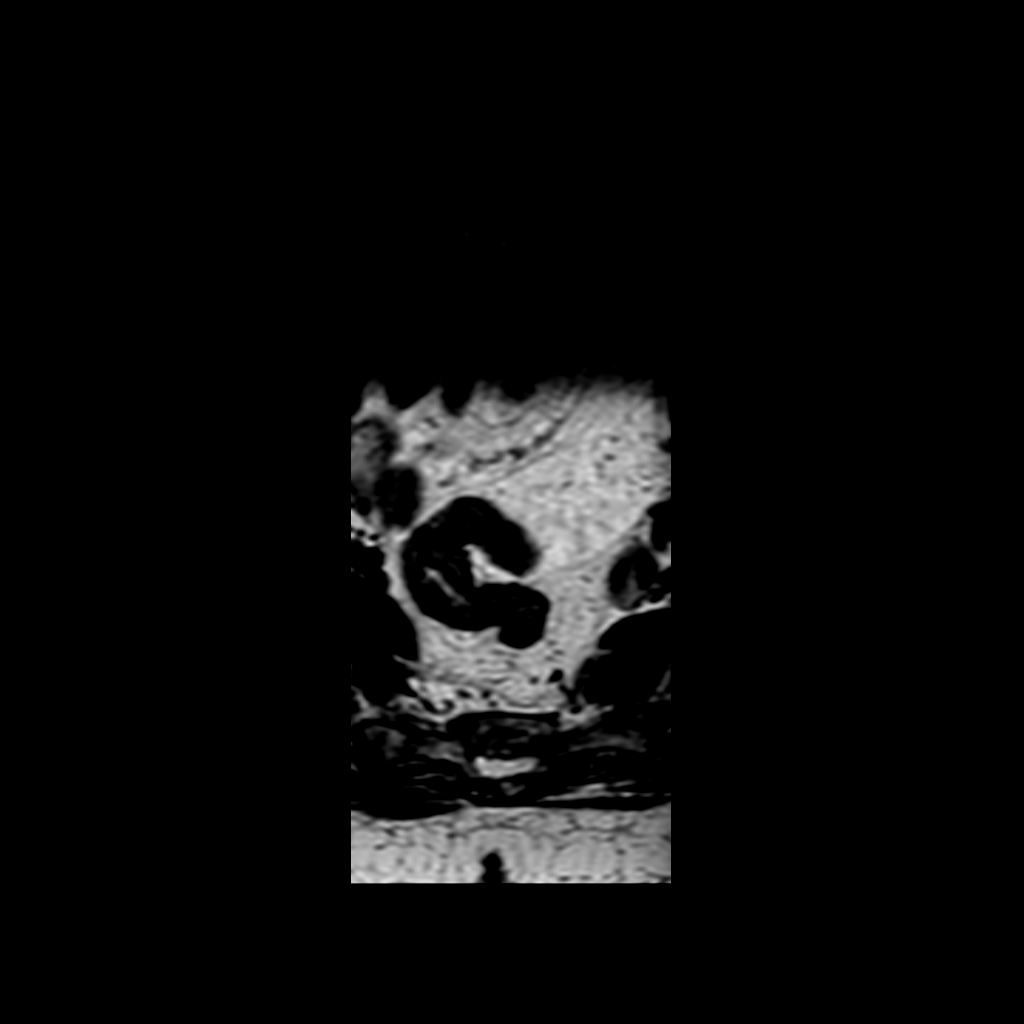
[im 20/99]
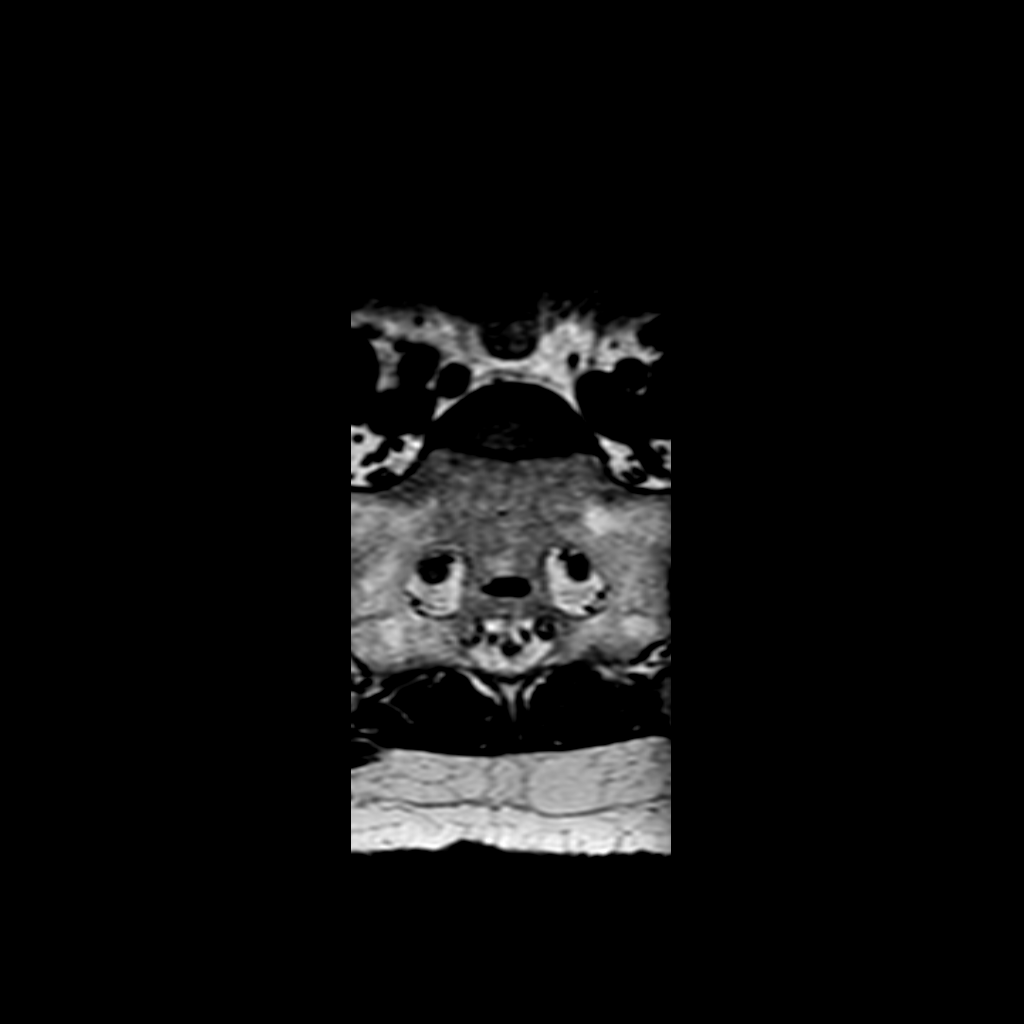

[13 of 48 positions shown; findings below may reference images not displayed]

FINDINGS: -------------------------------------------------------------------------------- 
------ 
GENERAL: 
Nomenclature is based on 5 lumbar type vertebral bodies.     
ALIGNMENT: Minimal levoconvex lumbar scoliosis. Normal sagittal alignment. 
VERTEBRAL BODY HEIGHT: Normal.  
MARROW SIGNAL: L3 hemangioma. The sclerotic focus previously described within 
the L3 vertebral body correlates with a T1 hypointense, mildly enhancing focus 
on todays MRI study. It measures up to 7 mm. The lesion appears stable to CT 
abdomen pelvis dated July 01, 2021 and is most consistent with a small somewhat 
atypical hemangioma. Stability over time is reassuring. No evidence of a marrow 
infiltrative process. 
There is abnormal marrow edema in the S3 and visualized S4 segments with 
associated enhancement consistent with sacral fracture. Incompletely visualized. 
This appears nondisplaced and may be of the insufficiency type. Minimal 
presacral fluid. 
No other marrow edema. 
The previously described subtle sclerotic lesion in the right iliac wing is not 
included within the image field of view. 
CORD SIGNAL: Normal distal spinal cord and cauda equina. Conus medullaris 
terminates at L2. 
ADDITIONAL FINDINGS: Distended urinary bladder. 
Modic I-II: None. 
Ligamentum Flavum > 2.5 mm: All levels. 
-------------------------------------------------------------------------------- 
------ 
SEGMENTAL: 
T12-L1: Normal disc height and signal. No herniation. Normal facets. No spinal 
canal or neural foraminal stenosis. 
L1-L2: Normal disc height and signal. No herniation. Normal facets. No spinal 
canal or neural foraminal stenosis. 
L2-L3: Normal disc height and signal. No herniation. Normal facets. No spinal 
canal or neural foraminal stenosis. 
L3-L4: Minimal annular bulge. Mild narrowing right lateral recess. Canal patent. 
Borderline left and mild right foraminal narrowing. 
L4-L5: Minimal annular bulge. Borderline canal stenosis. Mild bilateral 
foraminal narrowing. 
L5-S1: Posterior loss of disc height. Minimal annular bulge. Canal and foramina 
are patent. 
-------------------------------------------------------------------------------- 
------
IMPRESSION: No evidence of metastatic disease to the lumbar spine. The previously described 
sclerotic focus L3 level is most consistent with a small, somewhat atypical 
hemangioma and is stable to a CT abdomen pelvis dated July 01, 2021. Stability 
over time is reassuring. 
The previously described subtle sclerotic lesion right iliac wing is not 
included within the imaged field of view. Please correlate with MRI pelvis 
report. 
Subacute appearing nondisplaced S3 and S4 fracture, incompletely visualized, and 
likely of the insufficiency type. 
Mild degenerative changes as detailed above.
